# Patient Record
Sex: Male | Born: 1955 | Race: Black or African American | Hispanic: No | Marital: Single | Smoking: Never smoker
Health system: Southern US, Community
[De-identification: ages and names within clinical notes are randomized; demographics above are authoritative.]

## PROBLEM LIST (undated history)

## (undated) DIAGNOSIS — Q909 Down syndrome, unspecified: Secondary | ICD-10-CM

## (undated) DIAGNOSIS — M109 Gout, unspecified: Secondary | ICD-10-CM

## (undated) DIAGNOSIS — M1990 Unspecified osteoarthritis, unspecified site: Secondary | ICD-10-CM

## (undated) DIAGNOSIS — E7800 Pure hypercholesterolemia, unspecified: Secondary | ICD-10-CM

## (undated) DIAGNOSIS — R1013 Epigastric pain: Secondary | ICD-10-CM

## (undated) DIAGNOSIS — E876 Hypokalemia: Secondary | ICD-10-CM

## (undated) DIAGNOSIS — E669 Obesity, unspecified: Secondary | ICD-10-CM

## (undated) DIAGNOSIS — K5900 Constipation, unspecified: Secondary | ICD-10-CM

## (undated) HISTORY — DX: Obesity, unspecified: E66.9

## (undated) HISTORY — PX: NO PAST SURGERIES: SHX2092

## (undated) HISTORY — DX: Constipation, unspecified: K59.00

## (undated) HISTORY — DX: Pure hypercholesterolemia, unspecified: E78.00

## (undated) HISTORY — DX: Epigastric pain: R10.13

## (undated) HISTORY — DX: Gout, unspecified: M10.9

## (undated) HISTORY — DX: Unspecified osteoarthritis, unspecified site: M19.90

## (undated) HISTORY — DX: Down syndrome, unspecified: Q90.9

---

## 2000-10-16 ENCOUNTER — Encounter: Admission: RE | Admit: 2000-10-16 | Discharge: 2000-10-16 | Payer: Self-pay | Admitting: Internal Medicine

## 2000-10-16 ENCOUNTER — Encounter: Payer: Self-pay | Admitting: Internal Medicine

## 2000-12-03 ENCOUNTER — Inpatient Hospital Stay (HOSPITAL_COMMUNITY): Admission: AD | Admit: 2000-12-03 | Discharge: 2000-12-09 | Payer: Self-pay | Admitting: Internal Medicine

## 2000-12-03 ENCOUNTER — Encounter: Payer: Self-pay | Admitting: Internal Medicine

## 2000-12-05 ENCOUNTER — Encounter: Payer: Self-pay | Admitting: Internal Medicine

## 2000-12-09 ENCOUNTER — Encounter: Payer: Self-pay | Admitting: Internal Medicine

## 2004-02-02 ENCOUNTER — Observation Stay (HOSPITAL_COMMUNITY): Admission: RE | Admit: 2004-02-02 | Discharge: 2004-02-02 | Payer: Self-pay | Admitting: *Deleted

## 2005-06-06 ENCOUNTER — Encounter: Admission: RE | Admit: 2005-06-06 | Discharge: 2005-06-06 | Payer: Self-pay | Admitting: Internal Medicine

## 2008-11-22 ENCOUNTER — Encounter: Admission: RE | Admit: 2008-11-22 | Discharge: 2008-11-22 | Payer: Self-pay | Admitting: Internal Medicine

## 2009-02-12 ENCOUNTER — Emergency Department (HOSPITAL_COMMUNITY): Admission: EM | Admit: 2009-02-12 | Discharge: 2009-02-12 | Payer: Self-pay | Admitting: Emergency Medicine

## 2009-03-20 ENCOUNTER — Encounter: Admission: RE | Admit: 2009-03-20 | Discharge: 2009-03-20 | Payer: Self-pay | Admitting: Internal Medicine

## 2011-02-15 LAB — BASIC METABOLIC PANEL
Potassium: 3.7 mmol/L (ref 3.4–5.3)
Sodium: 141 mmol/L (ref 137–147)

## 2011-04-12 ENCOUNTER — Encounter: Payer: Self-pay | Admitting: Internal Medicine

## 2011-05-17 NOTE — Op Note (Signed)
NAMEJAKAIDEN, Richard Greene                            ACCOUNT NO.:  [id number]   MEDICAL RECORD NO.:  [id number]                   PATIENT TYPE:  INP   LOCATION:  2550                                 FACILITY:  [facility]   PHYSICIAN:  Scott R. Rehm, D.D.S.               DATE OF BIRTH:  12-29-1956   DATE OF PROCEDURE:  02/02/2004  DATE OF DISCHARGE:                                 OPERATIVE REPORT   PREOPERATIVE DIAGNOSES:  1. Mental retardation.  2. Down's syndrome.  3. Dental caries.   POSTOPERATIVE DIAGNOSES:  1. Mental retardation.  2. Down's syndrome.  3. Dental caries.   PROCEDURES PERFORMED:  1. Examination under anesthesia.  2. Dental x-rays.  3. Surgical extraction of teeth number 3, 4, 6, 7, 9, 12, 15, 13, 21, 28,     and 31.  4. Alveoloplasty upper right, upper left, lower left, and lower right     quadrants.   SURGEON:  Scott R. Egbert Garibaldi, D.D.S.   MEDS GIVEN PRE-OP:  Ampicillin 1 gram IV piggyback.   SUTURE:  3-0 chromic gut times multiple.   Local with 2% Xylocaine, 1:100,000 epinephrine 7 cc.   BRIEF DESCRIPTION OF THE OPERATIVE INDICATIONS:  Richard Greene is a 55 year old  black male with Down's syndrome referred from [facility] emergency  room with conditions of complaint of dental caries, toothaches for several  weeks. The patient was seen at my office on January 10, 2004, and he had a  history of Down's syndrome, inability to examine at the office.  Does have  history of gout.  I spoke with Dr. Minerva Areola L. Dean, phone number [phone], and  he was to admit Richard Greene for 23-hour observation for [place] to remove his teeth.  A  crude Panorex was able to be taken in my office, but the potential procedure  was exam under anesthesia and removal of teeth as necessary under general  anesthesia.   BRIEF DESCRIPTION OF OPERATIVE TECHNIQUE:  Richard Greene was brought to the  operating room and under sedated conditions from the anesthesiologist  already, the patient was kept on his stretcher, and  the patient was then  intubated on the table there.  The patient was then transferred to the OR  table with several nurses at which point the patient was then examined under  anesthesia, and dental x-rays were taken throughout the whole mouth. Those  were taken through the regions, upper right, upper left, lower left, lower  right times multiple.  These were then sent to the [facility] where  they were developed. At this point, the patient was then prepped and draped  in a standard fashion for an intraoral maxillofacial surgical procedure.  It  was elected to at this point to give 2% Xylocaine, 1:100,000 epinephrine x 7  cc.  This was given to the upper right, upper left, greater palatine  incisive nerve blocks were given,  inferior alveolar, long buccal, and buccal  infiltration was given to the mandible.  At this point, the x-rays were  starting to come out, and it was noted that Richard Greene had gross caries on teeth  3, 4, 6, 7, 9, 12, 13, 15, 21, 28, and 31.  At this point, a full thickness  mucoperiosteal flap was elevated to go from tooth 3, 4 and 6, 7, 9 area.  A  full thickness mucoperiosteal flap was elevated from tooth numbers 12 to 13  region and 15.  The Stryker and #8 round bur was used to cut above the  trough around tooth numbers 3, 4, 6, 7, 9, 12, 13, and 15 at which point a  periosteal elevator was used for minor subluxation of the teeth.  A straight  elevator was used, and a upper right anatomic forceps was used for  extraction of tooth number 3.  An upper Universal forceps was used for tooth  4, 6, 7, 9, 12, the small root on 13, and upper left anatomic forceps was  used for extraction of tooth number 15.  These were then done without any  complications.  Alveolar bur was then done.  Irrigation and direct suture  figure-of-8 x 6 were placed.  At this point, the low right region was then  addressed.  A #15 blade was used to make an incision from tooth #27, 28, and  then  from 30 to 31 region.  A periosteal elevator was used to elevate the  periosteum and its regions.  A #8 round bur was used for minor osseous  reduction followed by using a __________ straight elevator.  Tooth #28 and  31 were removed without complications.  Alveoloplasty occurred in this  region.  At this point, irrigation followed, and 3-0 chromic gut sutures x 6  were placed.  The bite block was then transferred to the patient's right  side, and the endotracheal tube was then transferred to the patient's right  side also with a new throat pack being placed after the old one was removed.  Full thickness mucoperiostomy with a 15 blade on tooth number 21/22 region.  A #8 round bur once again was used for osseous reduction, and a straight  elevator was used for removal of #21.  Alveoloplasty in this lower left  region was then done at this point.  Irrigation followed.  The 3-0 chromic  gut suture x 4 was placed.  At this point, the oral cavity was suctioned.  The throat pack was removed.  The needle, sponge, and instrument counts all  found to be correct.  No specimens were used.  They went to Pathology.   DISPOSITION:  The patient was then transferred to the recovery room, vital  signs stable.  The patient was to have 23-hour admission via Dr. Gwenyth Bender.                                               Scott R. Egbert Garibaldi, D.D.S.    SRR/MEDQ  D:  02/02/2004  T:  02/02/2004  Job:  [id number]   cc:   Minerva Areola L. August Saucer, M.D.  [address] [address]  [place]  [place] [address]  Fax: [phone]

## 2011-05-17 NOTE — Discharge Summary (Signed)
[place]  Patient:    Richard Greene, Richard Greene                         MRN: [id number] Adm. Date:  [id number] Disc. Date: [id number] Attending:  Gwenyth Bender                           Discharge Summary  FINAL DIAGNOSES:  1. Progressive colonic dysfunction.  2. Nausea and vomiting secondary to #1.  3. Downs syndrome progression.  4. Chronic ____________  5. Unsteady gait secondary to the above.  6. Obesity.  OPERATION PERFORMED:  1. Endoscopy flexible proctosigmoidoscopy on December 05, 2000.  2. CT scan of the head.  HISTORY OF PRESENT ILLNESS:  This was the first recent [place] admission for this 55 year old black male with severe Downs syndrome, who was brought in by his mother for evaluation of multiple complaints.  Over the past several months, the patient has had episodic nausea and vomiting which had been occurring spontaneously.  This has been associated with episodic constipation followed by diarrhea.  He has been known to have ____________ constipation demonstrated by x-ray.  Medications for this and empiric trials have not resulted in improvement of his nausea.  His oral intake had decreased recently as a result of this.  He has had occasional questionably melanotic stools as well.  Because of his severe mental retardation and inability to evaluate the patient thoroughly, he was admitted for further evaluation thereafter.  PAST MEDICAL HISTORY:  As noted above.  PHYSICAL EXAMINATION:  As noted above.  He has been previously managed in a day center.  Recently had had problems with increasing agitation for which he had been started on Risperdal which has been most effective.  Family has also noted that he has not been as ambulatory over the past several weeks as well.  HOSPITAL COURSE:  The patient was admitted for further evaluation of episodic nausea and vomiting of questionable etiology.  There was question  of underlying gastropathology.  The patient was noted to have difficulty with ambulation as well.  He was seen in consultation by Dr. Danise Edge of Gastroenterology.  It is felt that the patient would indeed need further evaluation including an EGD and flexible proctosigmoidoscopy.  This was subsequently scheduled for December 7 once patient was felt to be stable otherwise.  He underwent this procedure and then tolerated it well.  The hypopharynx, esophagus and stomach appeared okay.  They were unable to intubate the descending duodenum with a question of obstruction.  It was recommended that upper GI be obtained.  He notably also was seen by  Dr. Thad Ranger of neurology.  It was felt that his gait failure was most likely secondary to his underlying ____________ disease.  A CT scan, however, was recommended.  There was considerable difficulty obtaining scan due to the patients inability to cooperative.  He did require mild sedation with attendance of a family member.  He was subsequently found to have cortical atrophy with underlying small vessel disease.  No evidence suggesting acute stroke.  There was some question of some demineralization of the skull, however.  It was Dr. Renita Papa opinion that no further evaluation of patients gait disturbance would be pursued.  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  This was, however, more difficult to evaluate in  the patient due to his profound retardation.  The patients oral intake was noted to be impaired  while at the hospital. After cleansing enemas, the patients appetite did gradually improve.  He was, however, noted to have some problems swallowing.  He subsequently underwent a modified barium swallow with upper GI and small bowel follow-through.  Family members assisted him with this as well.  He was noted to have an extremely delayed initiation of the swallowing mechanism.  No obstruction was  noted. There was some evidence of tertiary wave formation suggesting a hiatal hernia. This was not definitely identified, however.  He was also noted to have incomplete rotation of the colon.  With these findings, it was felt that further management of patient could be pursued as an outpatient per Medicare guidelines.  This was explained to family.  Further adjustments of his medications were made including the introduction of Reglan at 10 mg p.o. h.c.  He will be maintained on Miralax. On this, he tolerated this well.  The patient was subsequently discharged home on December 09, 2000.  DISCHARGE MEDICATIONS:  1. Reglan 10 mg p.o. h.c.  2. Miralax 1 teaspoon in water q.d.  3. Risperdal 1 to 2 mg q.d. at 6 p.m.  4. ____________ nasal spray 2 puffs b.i.d.  5. Nexium 40 mg q.d. for two weeks.  He is to be maintained on a full liquid diet advancing as tolerated.  Family has been instructed to adjust his Risperdal as needed for significant agitation after consultation.  He will be seen in the office in three weeks time for follow-up. DD:  01/08/01 TD:  01/08/01 Job: [id number]

## 2011-05-17 NOTE — H&P (Signed)
[place]  Patient:    Richard Greene, Richard Greene                         MRN: [id number] Adm. Date:  [id number] Attending:  Gwenyth Bender                         History and Physical  CHIEF COMPLAINT:  Episodic nausea and vomiting, progressive weakness, and rectal bleeding.  HISTORY OF PRESENT ILLNESS:  This is the first recent [facility] admission for this 55 year old black male with severe down syndrome, who was brought in by his mother for evaluation of multiple complaints.  The patient over the past few months has had episodic nausea and vomiting which occurred spontaneously.  This has been associated with episodic constipation followed by diarrhea.  He has been treated with stool softening agents for possible atypical constipation.  X-rays have only showed mild constipation in the past at most.  The patient most recently was given an empiric trial of Nexium for his episodic nausea due to limited ability to evaluate as an outpatient.  Mother reports he had some initial improvement with decreased episodes.  However, over the past five days, he has had recurrent bouts of nausea without actual vomiting.  Notably, she has noted occasional severely dark, questionably melanotic stools.  He has been known to have rectal bleeding as well.  GI evaluation has been limited.  The family has also noted progressive weakness of his lower extremities. The mother notes that he will occasionally drag his right leg when walking.  They have noted increasing difficulty getting him to ambulate.  The patient has no significant shortness of breath on observation.  Appetite has remained fair to good on a daily basis.  Mentally, they have noted that he has been intermittently lethargic.  This was felt to be secondary to his Risperdal medication which had been recently decreased.  There has been no documented fever, no rigors noted.  Rare coughing.  Mother has noted  that he occasionally will gag after swallowing foods.  PAST MEDICAL HISTORY:  As noted above.  The patient with down syndrome with profound mental retardation.  He has been under the care of his mother.  He had previously attended a Day Center.  He was given Risperdal to help with excessive agitation which he has tolerated well.  Past medical history otherwise unremarkable.  MEDICATIONS: 1. Risperdal 2 mg p.o. b.i.d. 2. Nexium 40 mg p.o. q.h.s.  PHYSICAL EXAMINATION:  GENERAL:  He is a well-developed, overweight black male in no acute distress.  VITAL SIGNS:  Height 5 feet 2 inches, weight 194 pounds.  Blood pressure 112/64, pulse 92, respiratory rate 20, temperature 97.8.  HEENT:  Head normocephalic and atraumatic.  Fundi poorly visualized.  Pupils are equal, round and reactive to light.  Nose; he has left turbinate edema greater than right, presently doing more mouth breathing.  Thickened tongue. TMs are clear.  NECK:  No pulsing cervical nodes.  LUNGS:  Clear to passive breathing.  No rubs appreciated.  Clear to percussion.  HEART:  Normal S1 and S2.  No S3, S4, murmurs, or rubs.  ABDOMEN:  Mild distention.  Tympanic in the upper quadrants.  Dullness in the left lower quadrant.  No grimacing on compression at this time.  EXTREMITIES:  Full passive range of motion with some resistance noted. Negative Homans.  No edema.  NEUROLOGICAL:  The patient is awake.  He will follow on observation.  Will not perform manual commands.  1+ DTRs in the elbows bilaterally.  Trace knee DTRs bilaterally.  Unable to cooperative for strength testing.  SKIN:  Without active lesions.  LABORATORY DATA:  CBC 5200, hemoglobin 14, hematocrit 39.4.  Normal differential.  RDW 15.0.  Electrolytes; sodium 139, potassium 3.7, chloride 103, CO2 30, BUN 14, creatinine 1.0, calcium 8.8.  Amylase 39.  INR 1.0.  CT scan of LS spine pending.  IMPRESSION: 1. Episodic nausea and vomiting of questionable  etiology.  The patient with    questionable history of melanotic stools by report.  Rule out occult    peptic ulcer disease.  Rule out reflux.  Rule out gallbladder disease. 2. Colonic dysfunction.  The patient with bouts of constipation alternating    with diarrhea. 3. Rule out degenerative disk disease with questionable diskitis seen on    previous x-ray.  The patient symptomatically has been documented to have    progressive lower extremity weakness. 4. Down syndrome with severe mental retardation. 5. Mild obesity.  PLAN:  The patient will undergo GI evaluation with endoscopy and colon and/or sigmoidoscopy.  We will also obtain neurological evaluation of his lower back. He will need sedation for the above noted procedures.  Will continue him on his Risperdal in the interim.  Abdominal ultrasound will be obtained as well as indicated.  Further therapy thereafter.  Will also ask social services to review home situation to see if any further aid can be given to the mother as well. DD:  12/04/00 TD:  12/04/00 Job: [id number]

## 2011-05-17 NOTE — Consult Note (Signed)
[place]  Patient:    Richard Greene, Richard Greene                         MRN: [id number] Proc. Date: 12/06/00 Adm. Date:  [id number] Attending:  Gwenyth Bender                          Consultation Report  DATE OF BIRTH:  October 14, 1946  REQUESTING PHYSICIAN:  Lind Guest. August Saucer, M.D.  REASON FOR EVALUATION:  Gait difficulty.  HISTORY OF PRESENT ILLNESS:  This is the initial inpatient consultation evaluation for this 55 year old man with history of Down syndrome and profound mental retardation who is presently admitted for a GI work-up for nausea, vomiting, constipation, and hematochezia.  He has had a over a one year period of time he has developed progressive problems with gait and some weakness of the lower extremities.  History was provided by his mother who notes that he initially had difficulty standing out of a seated position and this has slowly progressed.  More recently he has had some easily fatigability with walking and increasing wide-based waddling gait.  She also notes that he tends to do some posturing of his left upper extremity when he walks.  He continues to be able to move all extremities spontaneously.  There has been no noted atrophy of the lower extremities.  He is having some diarrhea, but is not having bowel or bladder incontinence and is able to get to the bathroom.  There has been no evidence of sensory loss in the lower extremities.  She does think he has had some low back pain recently.  REVIEW OF SYSTEMS:  Nausea and vomiting along with hematochezia as above.  A 10 pounds weight loss over the past two months.  Low back pain as above.  No fevers, chills.  No known injury of the back or lower extremities.  No obvious mental status changes.  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  FAMILY HISTORY:  No known significant family illnesses.  SOCIAL  HISTORY:  He lives with his mom and dad who are care providers.  He also has a brother and sister and a couple of aunts who help them out.  He is able to ambulate independently, but is not able to participate in any of his activities of daily living or self care.  MEDICATIONS:  Risperdal, Protonix in hospital.  ALLERGIES:  No known drug allergies.  PHYSICAL EXAMINATION:  VITAL SIGNS:  Temperature 98.6, blood pressure 102/62, pulse 92, respirations 20, O2 saturation 98% on room air.  GENERAL:  He is alert, in no evident distress.  CHEST:  Clear to auscultation with decreased effort.  HEART:  Regular rate and rhythm without murmurs.  NECK:  Supple without carotid bruits.  EXTREMITIES:  No cyanosis.  Pulses 2+.  There is a 10 degree flexion contracture of both knees, but none at the ankles.  NEUROLOGIC:  Mental status:  He appears alert.  He is not able to follow commands, but dose respond to direct stimuli from the examiner.  He occasionally regards the examiner, but this is intermittent.  There is really no speech, but he does occasionally make grunting sounds.  Cranial nerves: Blind in the right eye.  Left pupil is reactive and both eyes move normally in all directions.  There  is no facial asymmetry.  He tends to keep the tongue protruded, but seems to move the mouth and tongue symmetrically.  Motor examination:  Normal bulk and tone.  No atrophy or vesiculations.  He is able to move all extremities well against gravity.  Sensory examination: He withdraws from tickle in all extremities vigorously.  Cerebellar is not tested.  Rapid alternating movements are not tested.  Reflexes are 2+ and symmetric.  Toes are downgoing.  Gait examination:  He requires two person assist to stand from the bed; however, he is able to walk independently with standby assist.  His gait is wide-based and waddling.  He tends to walk with the feet, particularly the left foot, externally rotated.  He  does not appear to be in pain when he walks.  LABORATORIES:  A CT of the lumbosacral spine is performed and shows evidence of discitis at L4/5 with mild posterior disk bulge and question of chronic infection.  CBC and BMET are normal.  Coags are normal.  Amylase is normal.  IMPRESSION: 1. Progressive gait disturbance.  Suspect this is more of a central process    rather than a peripheral process related to his discitis.  Would be    primarily concerned about progression of his known brain disease or about    interval development of hydrocephalus.  Cervical myelopathy is also a    possibility but he does not really have typical examination stigmata of    this. 2. Lumbosacral discitis.  This may be contributing to discomfort in his back    which may be part of the problem, but doubt that it is primary. 3. Down syndrome with profound mental retardation.  RECOMMENDATIONS: 1. A CT of the head to rule out hydrocephalus. 2. Physical therapy evaluation for gait assistance. 3. If he does not have hydrocephalus or a correctable cervical spine problem,    it is unlikely that this is going to be a fixable problem. DD:  12/04/00 TD:  12/04/00 Job: [id number]

## 2012-12-14 ENCOUNTER — Encounter: Payer: Self-pay | Admitting: Hematology

## 2012-12-14 DIAGNOSIS — K599 Functional intestinal disorder, unspecified: Secondary | ICD-10-CM | POA: Insufficient documentation

## 2012-12-14 DIAGNOSIS — M109 Gout, unspecified: Secondary | ICD-10-CM | POA: Insufficient documentation

## 2012-12-14 DIAGNOSIS — K056 Periodontal disease, unspecified: Secondary | ICD-10-CM

## 2012-12-14 DIAGNOSIS — K5900 Constipation, unspecified: Secondary | ICD-10-CM | POA: Insufficient documentation

## 2012-12-14 DIAGNOSIS — F79 Unspecified intellectual disabilities: Secondary | ICD-10-CM | POA: Insufficient documentation

## 2012-12-14 DIAGNOSIS — M1990 Unspecified osteoarthritis, unspecified site: Secondary | ICD-10-CM | POA: Insufficient documentation

## 2013-07-30 ENCOUNTER — Inpatient Hospital Stay (HOSPITAL_COMMUNITY): Payer: Medicare Other

## 2013-07-30 ENCOUNTER — Encounter (HOSPITAL_COMMUNITY): Payer: Self-pay

## 2013-07-30 ENCOUNTER — Emergency Department (HOSPITAL_COMMUNITY): Payer: Medicare Other

## 2013-07-30 ENCOUNTER — Inpatient Hospital Stay (HOSPITAL_COMMUNITY)
Admission: EM | Admit: 2013-07-30 | Discharge: 2013-08-06 | DRG: 392 | Disposition: A | Discharge status: Home Health | Payer: Medicare Other | Attending: Internal Medicine | Admitting: Internal Medicine

## 2013-07-30 DIAGNOSIS — K056 Periodontal disease, unspecified: Secondary | ICD-10-CM

## 2013-07-30 DIAGNOSIS — K599 Functional intestinal disorder, unspecified: Secondary | ICD-10-CM

## 2013-07-30 DIAGNOSIS — E860 Dehydration: Secondary | ICD-10-CM

## 2013-07-30 DIAGNOSIS — N179 Acute kidney failure, unspecified: Secondary | ICD-10-CM | POA: Diagnosis not present

## 2013-07-30 DIAGNOSIS — K5289 Other specified noninfective gastroenteritis and colitis: Principal | ICD-10-CM | POA: Diagnosis present

## 2013-07-30 DIAGNOSIS — R112 Nausea with vomiting, unspecified: Secondary | ICD-10-CM

## 2013-07-30 DIAGNOSIS — Q909 Down syndrome, unspecified: Secondary | ICD-10-CM

## 2013-07-30 DIAGNOSIS — K5900 Constipation, unspecified: Secondary | ICD-10-CM

## 2013-07-30 DIAGNOSIS — E2749 Other adrenocortical insufficiency: Secondary | ICD-10-CM | POA: Diagnosis present

## 2013-07-30 DIAGNOSIS — F79 Unspecified intellectual disabilities: Secondary | ICD-10-CM

## 2013-07-30 DIAGNOSIS — I498 Other specified cardiac arrhythmias: Secondary | ICD-10-CM

## 2013-07-30 DIAGNOSIS — M1990 Unspecified osteoarthritis, unspecified site: Secondary | ICD-10-CM

## 2013-07-30 DIAGNOSIS — R569 Unspecified convulsions: Secondary | ICD-10-CM

## 2013-07-30 DIAGNOSIS — E876 Hypokalemia: Secondary | ICD-10-CM | POA: Diagnosis not present

## 2013-07-30 DIAGNOSIS — R197 Diarrhea, unspecified: Secondary | ICD-10-CM

## 2013-07-30 DIAGNOSIS — M109 Gout, unspecified: Secondary | ICD-10-CM

## 2013-07-30 DIAGNOSIS — I959 Hypotension, unspecified: Secondary | ICD-10-CM | POA: Diagnosis present

## 2013-07-30 DIAGNOSIS — R001 Bradycardia, unspecified: Secondary | ICD-10-CM

## 2013-07-30 DIAGNOSIS — K529 Noninfective gastroenteritis and colitis, unspecified: Secondary | ICD-10-CM

## 2013-07-30 DIAGNOSIS — E2740 Unspecified adrenocortical insufficiency: Secondary | ICD-10-CM

## 2013-07-30 DIAGNOSIS — N390 Urinary tract infection, site not specified: Secondary | ICD-10-CM

## 2013-07-30 DIAGNOSIS — A498 Other bacterial infections of unspecified site: Secondary | ICD-10-CM | POA: Diagnosis present

## 2013-07-30 DIAGNOSIS — E669 Obesity, unspecified: Secondary | ICD-10-CM | POA: Diagnosis present

## 2013-07-30 DIAGNOSIS — D649 Anemia, unspecified: Secondary | ICD-10-CM | POA: Diagnosis present

## 2013-07-30 DIAGNOSIS — D72829 Elevated white blood cell count, unspecified: Secondary | ICD-10-CM | POA: Diagnosis present

## 2013-07-30 DIAGNOSIS — E7800 Pure hypercholesterolemia, unspecified: Secondary | ICD-10-CM | POA: Diagnosis present

## 2013-07-30 DIAGNOSIS — E870 Hyperosmolality and hypernatremia: Secondary | ICD-10-CM | POA: Diagnosis present

## 2013-07-30 DIAGNOSIS — F411 Generalized anxiety disorder: Secondary | ICD-10-CM | POA: Diagnosis present

## 2013-07-30 HISTORY — DX: Hypokalemia: E87.6

## 2013-07-30 LAB — MAGNESIUM: Magnesium: 2.8 mg/dL — ABNORMAL HIGH (ref 1.5–2.5)

## 2013-07-30 LAB — CREATININE, SERUM
Creatinine, Ser: 2.04 mg/dL — ABNORMAL HIGH (ref 0.50–1.35)
GFR calc Af Amer: 41 mL/min — ABNORMAL LOW (ref >90)
GFR calc non Af Amer: 35 mL/min — ABNORMAL LOW (ref >90)

## 2013-07-30 LAB — CBC
Platelets: 214 10*3/uL (ref 150–400)
RBC: 2.94 MIL/uL — ABNORMAL LOW (ref 4.22–5.81)
RDW: 17.3 % — ABNORMAL HIGH (ref 11.5–15.5)
WBC: 15.9 10*3/uL — ABNORMAL HIGH (ref 4.0–10.5)

## 2013-07-30 LAB — URINE MICROSCOPIC-ADD ON

## 2013-07-30 LAB — COMPREHENSIVE METABOLIC PANEL
ALT: 6 U/L (ref 0–53)
Alkaline Phosphatase: 77 U/L (ref 39–117)
BUN: 25 mg/dL — ABNORMAL HIGH (ref 6–23)
CO2: 30 mEq/L (ref 19–32)
Chloride: 104 mEq/L (ref 96–112)
GFR calc Af Amer: 38 mL/min — ABNORMAL LOW (ref >90)
Glucose, Bld: 94 mg/dL (ref 70–99)
Potassium: <2 mEq/L — CL (ref 3.5–5.1)
Total Bilirubin: 1 mg/dL (ref 0.3–1.2)

## 2013-07-30 LAB — BASIC METABOLIC PANEL
Calcium: 7.5 mg/dL — ABNORMAL LOW (ref 8.4–10.5)
Calcium: 7.3 mg/dL — ABNORMAL LOW (ref 8.4–10.5)
Creatinine, Ser: 2.03 mg/dL — ABNORMAL HIGH (ref 0.50–1.35)
GFR calc Af Amer: 41 mL/min — ABNORMAL LOW (ref >90)
GFR calc Af Amer: 44 mL/min — ABNORMAL LOW (ref >90)
GFR calc non Af Amer: 35 mL/min — ABNORMAL LOW (ref >90)
GFR calc non Af Amer: 38 mL/min — ABNORMAL LOW (ref >90)
Glucose, Bld: 106 mg/dL — ABNORMAL HIGH (ref 70–99)
Sodium: 144 mEq/L (ref 135–145)

## 2013-07-30 LAB — URINALYSIS, ROUTINE W REFLEX MICROSCOPIC
Glucose, UA: NEGATIVE mg/dL
Ketones, ur: NEGATIVE mg/dL
Protein, ur: 100 mg/dL — AB
pH: 6 (ref 5.0–8.0)

## 2013-07-30 LAB — CBC WITH DIFFERENTIAL/PLATELET
Hemoglobin: 10.1 g/dL — ABNORMAL LOW (ref 13.0–17.0)
Lymphocytes Relative: 5 % — ABNORMAL LOW (ref 12–46)
Lymphs Abs: 0.8 10*3/uL (ref 0.7–4.0)
MCH: 34 pg (ref 26.0–34.0)
Monocytes Relative: 5 % (ref 3–12)
Neutro Abs: 14.1 10*3/uL — ABNORMAL HIGH (ref 1.7–7.7)
Neutrophils Relative %: 90 % — ABNORMAL HIGH (ref 43–77)
RBC: 2.97 MIL/uL — ABNORMAL LOW (ref 4.22–5.81)
WBC: 15.7 10*3/uL — ABNORMAL HIGH (ref 4.0–10.5)

## 2013-07-30 LAB — GLUCOSE, CAPILLARY: Glucose-Capillary: 84 mg/dL (ref 70–99)

## 2013-07-30 LAB — LIPASE, BLOOD: Lipase: 16 U/L (ref 11–59)

## 2013-07-30 MED ORDER — SODIUM CHLORIDE 0.9 % IV SOLN: 250.0000 mL | INTRAVENOUS | Status: DC | PRN

## 2013-07-30 MED ORDER — SODIUM CHLORIDE 0.9 % IV BOLUS (SEPSIS)
1000.0000 mL | Freq: Once | INTRAVENOUS | Status: AC
  Administered 2013-07-30: 1000 mL via INTRAVENOUS

## 2013-07-30 MED ORDER — HYDROCORTISONE SOD SUCCINATE 100 MG IJ SOLR
50.0000 mg | Freq: Four times a day (QID) | INTRAMUSCULAR | Status: DC
  Administered 2013-07-30 – 2013-08-01 (×6): 50 mg via INTRAVENOUS
  Filled 2013-07-30 (×11): qty 1

## 2013-07-30 MED ORDER — POTASSIUM CHLORIDE 10 MEQ/100ML IV SOLN
INTRAVENOUS | Status: AC
  Filled 2013-07-30: qty 100

## 2013-07-30 MED ORDER — HEPARIN SODIUM (PORCINE) 5000 UNIT/ML IJ SOLN
5000.0000 [IU] | Freq: Three times a day (TID) | INTRAMUSCULAR | Status: DC
  Administered 2013-07-30 – 2013-08-05 (×13): 5000 [IU] via SUBCUTANEOUS
  Filled 2013-07-30 (×24): qty 1

## 2013-07-30 MED ORDER — POTASSIUM CHLORIDE 10 MEQ/100ML IV SOLN
10.0000 meq | Freq: Once | INTRAVENOUS | Status: AC
  Administered 2013-07-30 (×2): 10 meq via INTRAVENOUS
  Filled 2013-07-30: qty 100

## 2013-07-30 MED ORDER — POTASSIUM CHLORIDE 20 MEQ/15ML (10%) PO LIQD
40.0000 meq | Freq: Once | ORAL | Status: AC
  Administered 2013-07-30: 40 meq via ORAL
  Filled 2013-07-30: qty 30

## 2013-07-30 MED ORDER — DEXTROSE 5 % IV SOLN
1.0000 g | Freq: Once | INTRAVENOUS | Status: AC
  Administered 2013-07-30: 1 g via INTRAVENOUS
  Filled 2013-07-30: qty 10

## 2013-07-30 MED ORDER — POTASSIUM CHLORIDE 10 MEQ/100ML IV SOLN
10.0000 meq | INTRAVENOUS | Status: AC
  Administered 2013-07-30 (×3): 10 meq via INTRAVENOUS
  Filled 2013-07-30: qty 400

## 2013-07-30 MED ORDER — MAGNESIUM SULFATE 40 MG/ML IJ SOLN
2.0000 g | Freq: Once | INTRAMUSCULAR | Status: AC
  Administered 2013-07-30: 2 g via INTRAVENOUS
  Filled 2013-07-30: qty 50

## 2013-07-30 MED ORDER — CIPROFLOXACIN IN D5W 400 MG/200ML IV SOLN
400.0000 mg | Freq: Two times a day (BID) | INTRAVENOUS | Status: DC
  Administered 2013-07-30 – 2013-08-03 (×8): 400 mg via INTRAVENOUS
  Filled 2013-07-30 (×10): qty 200

## 2013-07-30 MED ORDER — METRONIDAZOLE IN NACL 5-0.79 MG/ML-% IV SOLN
500.0000 mg | Freq: Four times a day (QID) | INTRAVENOUS | Status: DC
  Administered 2013-07-30 – 2013-08-01 (×7): 500 mg via INTRAVENOUS
  Filled 2013-07-30 (×9): qty 100

## 2013-07-30 MED ORDER — POTASSIUM CHLORIDE 10 MEQ/100ML IV SOLN
INTRAVENOUS | Status: AC
  Administered 2013-07-30: 10 meq via INTRAVENOUS
  Filled 2013-07-30: qty 100

## 2013-07-30 MED ORDER — ONDANSETRON HCL 4 MG/2ML IJ SOLN
4.0000 mg | Freq: Once | INTRAMUSCULAR | Status: AC
  Administered 2013-07-30: 4 mg via INTRAVENOUS
  Filled 2013-07-30: qty 2

## 2013-07-30 MED ORDER — POTASSIUM CHLORIDE IN NACL 20-0.45 MEQ/L-% IV SOLN
INTRAVENOUS | Status: DC
  Administered 2013-07-30: 1000 mL via INTRAVENOUS
  Administered 2013-07-30: 20:00:00 via INTRAVENOUS
  Filled 2013-07-30 (×4): qty 1000

## 2013-07-30 MED ORDER — PANTOPRAZOLE SODIUM 40 MG IV SOLR
40.0000 mg | Freq: Every day | INTRAVENOUS | Status: DC
  Administered 2013-07-30 – 2013-08-01 (×3): 40 mg via INTRAVENOUS
  Filled 2013-07-30 (×6): qty 40

## 2013-07-30 NOTE — Consult Note (Signed)
PULMONARY  / CRITICAL CARE MEDICINE  Name: Richard Greene MRN: [id number] DOB: 04-Jan-1956    ADMISSION DATE:  07/30/2013  REFERRING MD :  EDP - Dr. Bernette Mayers  PRIMARY SERVICE: PCCM  CHIEF COMPLAINT:  Bradycardia, Dehydration  BRIEF PATIENT DESCRIPTION: 57 y/o M, with down syndrome presented to [facility] ER on 8/1 with N/V/D x48 hours.  PCCM consulted for assistance with care per [facility].  SIGNIFICANT EVENTS / STUDIES:  8/1 - Admit with N/V/D, found to have bradycardia, profound hypokalemia  LINES / TUBES:  CULTURES: 8/1 Stool>>>   ANTIBIOTICS: Cipro 8/1>>> Flagyl 8/1>>>  HISTORY OF PRESENT ILLNESS:  57 y/o M, with PMH of down syndrome, gout, constipation, osteoarthritis who presented to [facility] ER on 8/1 with a two month hx of diarrhea, decreased appetite since 7/29 and vomiting x48 hours.  Mother is primary care giver and is a very poor historian.  Information obtained from previous medical documentation.  She notes that he has had decreased appetite over past few days.  She notes that he does not take medications on daily basis.   ER evaluation demonstrated dehydration, hypokalemia, n/v/d, and subsequent acute renal failure.  PCCM consulted to assist with ICU care.    PAST MEDICAL HISTORY :  Past Medical History  Diagnosis Date  . Osteoarthrosis, unspecified whether generalized or localized, unspecified site   . Gout, unspecified   . Obesity, unspecified   . Pure hypercholesterolemia   . Down syndrome   . Dyspepsia   . Constipation    History reviewed. No pertinent past surgical history. Prior to Admission medications   Not on File   No Known Allergies  FAMILY HISTORY:  History reviewed. No pertinent family history. SOCIAL HISTORY:  reports that he has never smoked. He does not have any smokeless tobacco history on file. His alcohol and drug histories are not on file.  REVIEW OF SYSTEMS:  Unable to complete as pt baseline is altered, Mother poor historian.   SUBJECTIVE:   VITAL  SIGNS: Temp:  [97.7 F (36.5 C)] 97.7 F (36.5 C) (08/01 1204) Pulse Rate:  [37-52] 48 (08/01 1545) Resp:  [10-20] 13 (08/01 1600) BP: (80-102)/(34-65) 91/54 mmHg (08/01 1600) SpO2:  [96 %-100 %] 100 % (08/01 1558)  HEMODYNAMICS:    VENTILATOR SETTINGS:    INTAKE / OUTPUT: Intake/Output   None     PHYSICAL EXAMINATION: General:  Chronically ill, down syndrome Neuro:  Awake, alert, MAE  HEENT:  Mm pale / dry Cardiovascular:  s1s2 rrr, brady Lungs:  resp's even/non-labored / shallow, lungs bilaterally clear Abdomen:  Round/soft, bsx4 active Musculoskeletal:  No acute deformities Skin:  Dry, pale, no edema  LABS:  CBC Recent Labs     07/30/13  1315  WBC  15.7*  HGB  10.1*  HCT  26.9*  PLT  209   Coag's No results found for this basename: APTT, INR,  in the last 72 hours BMET Recent Labs     07/30/13  1315  NA  146*  K  <2.0*  CL  104  CO2  30  BUN  25*  CREATININE  2.17*  GLUCOSE  94   Electrolytes Recent Labs     07/30/13  1315  CALCIUM  7.6*   Sepsis Markers No results found for this basename: LACTICACIDVEN, PROCALCITON, O2SATVEN,  in the last 72 hours ABG No results found for this basename: PHART, PCO2ART, PO2ART,  in the last 72 hours Liver Enzymes Recent Labs     07/30/13  1315  AST  15  ALT  6  ALKPHOS  77  BILITOT  1.0  ALBUMIN  2.2*   Cardiac Enzymes No results found for this basename: TROPONINI, PROBNP,  in the last 72 hours Glucose Recent Labs     07/30/13  1240  07/30/13  1451  GLUCAP  90  84    Imaging Ct Head Wo Contrast  07/30/2013   *RADIOLOGY REPORT*  Clinical Data:  Fall  CT HEAD WITHOUT CONTRAST CT CERVICAL SPINE WITHOUT CONTRAST  Technique:  Multidetector CT imaging of the head and cervical spine was performed following the standard protocol without intravenous contrast.  Multiplanar CT image reconstructions of the cervical spine were also generated.  Comparison:   None  CT HEAD  Findings: Moderate atrophy.  Mild  chronic microvascular ischemic change in the white matter.  No acute infarct, hemorrhage, or mass lesion.  Negative for skull fracture.  IMPRESSION: Atrophy and chronic microvascular ischemia.  No acute intracranial abnormality.  CT CERVICAL SPINE  Findings: There is transverse ligament laxity of C1 with widening of the atlanto dens interval and mild basilar invagination.  Advanced disc degeneration with disc space narrowing and spurring C3-C7.  Bilateral facet degeneration C3-4 and C4-5 and C5-6.  Negative for fracture.  IMPRESSION: Basilar vegetation with laxity of the  transverse ligament of C1.  Multilevel disc and facet degeneration.  Negative for fracture.   Original Report Authenticated By: Janeece Riggers, M.D.   Ct Cervical Spine Wo Contrast  07/30/2013   *RADIOLOGY REPORT*  Clinical Data:  Fall  CT HEAD WITHOUT CONTRAST CT CERVICAL SPINE WITHOUT CONTRAST  Technique:  Multidetector CT imaging of the head and cervical spine was performed following the standard protocol without intravenous contrast.  Multiplanar CT image reconstructions of the cervical spine were also generated.  Comparison:   None  CT HEAD  Findings: Moderate atrophy.  Mild chronic microvascular ischemic change in the white matter.  No acute infarct, hemorrhage, or mass lesion.  Negative for skull fracture.  IMPRESSION: Atrophy and chronic microvascular ischemia.  No acute intracranial abnormality.  CT CERVICAL SPINE  Findings: There is transverse ligament laxity of C1 with widening of the atlanto dens interval and mild basilar invagination.  Advanced disc degeneration with disc space narrowing and spurring C3-C7.  Bilateral facet degeneration C3-4 and C4-5 and C5-6.  Negative for fracture.  IMPRESSION: Basilar vegetation with laxity of the  transverse ligament of C1.  Multilevel disc and facet degeneration.  Negative for fracture.   Original Report Authenticated By: Janeece Riggers, M.D.     CXR: 8/1 - pending  ASSESSMENT /  PLAN:  PULMONARY A: At Risk Aspiration P:   -assess CXR -pulmonary hygiene as able -monitor for decompensation, may require ETT -limit narcs   CARDIOVASCULAR A:  Bradycardia, likely secondary to profound hypokalemia Hypercholesterolemia P:  -assess TSH -defer to cards for ECHO -Cardiology Consulted -f/u EKG in am -aggressive K replacement then follow up q2h -mag IV -pacer pads chest, atropine bedside  RENAL A:   Mild hypernatremia Hypokalemia - received 2gm mg, 40 kcl oral, 10 mEq IV in ER Acute Kidney Injury - in setting of vomiting / dehydration P:   -aggressive hydration with 1/2NS with 20 KCL -repeat BMP Q4 x 2 -KCL IV x 4 more -2gm mg now -hold bicarb with hypokalemia, will aggrevate Foley consideration  GASTROINTESTINAL A:   Nausea / Vomiting Diarrhea Obesity R/o gastroenteritis P:   -PRN zofran -NPO -PPI -see ID -assess KUB  -may  require CT, follow clinical course  HEMATOLOGIC A:   Anemia  P:  -monitor / supportive care -consider tx if hgb <7  INFECTIOUS A:   Leukocytosis - in setting of n/v/d.  Lactic acid reassuring. R/o gastroenteritis P:   -empiric cipro / flagyl for GI coverage -KUB -low threshold CT  ENDOCRINE A:  Assess hypothyroidism, adrenal dz  P:   -assess TSH  -follow glucose on BMP -may need cortisol random then stress steroids  NEUROLOGIC A:   Down Syndrome  P:   -monitor  / supportive care  Canary Brim, NP-C [facility] Pgr: [phone] or [phone]   I have personally obtained a history, examined the patient, evaluated laboratory and imaging results, formulated the assessment and plan and placed orders.  CRITICAL CARE: The patient is critically ill with multiple organ systems failure and requires high complexity decision making for assessment and support, frequent evaluation and titration of therapies, application of advanced monitoring technologies and extensive interpretation of  multiple databases. Critical Care Time devoted to patient care services described in this note is  35 minutes.    Mcarthur Rossetti. Tyson Alias, MD, FACP Pgr: [phone] [facility]   07/30/2013, 4:03 PM

## 2013-07-30 NOTE — ED Notes (Signed)
Pt transported to and from radiology on stretcher with tech, tolerated well.  

## 2013-07-30 NOTE — Progress Notes (Signed)
Unable to assess pt. Properly. Will not allow temperature or lower extremity assessments to be done. Pt. Does not talk so communication is difficult. Spoke with Mother who is poor historian.

## 2013-07-30 NOTE — ED Notes (Signed)
Urinary catheter placed using sterile technique, getting clear, yellow urine.  Catheter removed after specimen obtained, pt tolerated well.

## 2013-07-30 NOTE — H&P (Signed)
[facility] History and Physical  [name]:[id number] DOB: [birth year] DOA: 07/30/2013   PCP: Willey Blade, MD  Specialists: None  Chief Complaint: Diarrhea and vomiting on and off for a month  HPI: Richard Greene is a 57 y.o. male with a past medical history of Down syndrome, who does not take any medications at home. Richard Greene lives with his mother who has accompanied him today. Richard Greene is unable to provide any history. Mother is a very poor historian as well. Apparently, Richard Greene has been having watery stools, up to 2-3 times a day for the last one month. She is unable to describe the stool but says it has been small quantity. He's also been vomiting sporadically. There has been no history of fever or chills. Richard Greene's mother hasn't noticed any grimacing, signifying pain. She denies that the Richard Greene has been on antibiotics recently. She tells me that he has been urinating without any difficulty. Hasn't noticed any decrease in the amount urine. Due to the Richard Greene's advanced mental retardation history is extremely limited.  Home Medications: Prior to Admission medications   Not on File  None per Richard Greene's mother.  Allergies: No Known Allergies  Past Medical History: Past Medical History  Diagnosis Date  . Osteoarthrosis, unspecified whether generalized or localized, unspecified site   . Gout, unspecified   . Obesity, unspecified   . Pure hypercholesterolemia   . Down syndrome   . Dyspepsia   . Constipation     No surgery in past.  Social History:  reports that he has never smoked. He does not have any smokeless tobacco history on file. His alcohol and drug histories are not on file.  Living Situation: Lives with his mother Activity Level: Is able to ambulate independently   Family History:  History of unknown cancer in the family  Review of Systems - Unable to do due to mental retardation  Physical Examination  Filed Vitals:   07/30/13 1350 07/30/13 1422 07/30/13  1438 07/30/13 1558  BP: 86/47 87/46 90/65  80/34  Pulse: 37 41 49   Temp:      TempSrc:      Resp: 20 16 18 16   SpO2: 100% 100% 100% 100%    General appearance: distracted, slowed mentation, syndromic appearance - Down's and uncooperative Head: atraumatic Eyes: conjunctivae/corneas clear. PERRL, EOM's intact.  Throat: dry mm Neck: no adenopathy, no carotid bruit, no JVD, supple, symmetrical, trachea midline and thyroid not enlarged, symmetric, no tenderness/mass/nodules Resp: clear to auscultation bilaterally Cardio: S1S2 is bradycardic. no S3, S4. No rubs, murmurs, or bruit. GI: soft, non-tender; bowel sounds normal; no masses,  no organomegaly Extremities: extremities normal, atraumatic, no cyanosis or edema Pulses: slow but symmetric Skin: Skin color, texture, turgor normal. No rashes or lesions Lymph nodes: Cervical, supraclavicular, and axillary nodes normal. Neurologic: Advanced mental retardation, without any focal deficits  Laboratory Data: Results for orders placed during the hospital encounter of 07/30/13 (from the past 48 hour(s))  GLUCOSE, CAPILLARY     Status: None   Collection Time    07/30/13 12:40 PM      Result Value Range   Glucose-Capillary 90  70 - 99 mg/dL  CBC WITH DIFFERENTIAL     Status: Abnormal   Collection Time    07/30/13  1:15 PM      Result Value Range   WBC 15.7 (*) 4.0 - 10.5 K/uL   RBC 2.97 (*) 4.22 - 5.81 MIL/uL   Hemoglobin 10.1 (*) 13.0 - 17.0 g/dL  HCT 26.9 (*) 39.0 - 52.0 %   MCV 90.6  78.0 - 100.0 fL   MCH 34.0  26.0 - 34.0 pg   MCHC 37.0 (*) 30.0 - 36.0 g/dL   RDW 56.2 (*) 13.0 - 86.5 %   Platelets 209  150 - 400 K/uL   Neutrophils Relative % 90 (*) 43 - 77 %   Neutro Abs 14.1 (*) 1.7 - 7.7 K/uL   Lymphocytes Relative 5 (*) 12 - 46 %   Lymphs Abs 0.8  0.7 - 4.0 K/uL   Monocytes Relative 5  3 - 12 %   Monocytes Absolute 0.7  0.1 - 1.0 K/uL   Eosinophils Relative 0  0 - 5 %   Eosinophils Absolute 0.0  0.0 - 0.7 K/uL   Basophils  Relative 0  0 - 1 %   Basophils Absolute 0.0  0.0 - 0.1 K/uL  COMPREHENSIVE METABOLIC PANEL     Status: Abnormal   Collection Time    07/30/13  1:15 PM      Result Value Range   Sodium 146 (*) 135 - 145 mEq/L   Potassium <2.0 (*) 3.5 - 5.1 mEq/L   Comment: REPEATED TO VERIFY     CRITICAL RESULT CALLED TO, READ BACK BY AND VERIFIED WITH:     JULIE MARON,RN AT 1407 07/30/13 BY ZPERRY.   Chloride 104  96 - 112 mEq/L   CO2 30  19 - 32 mEq/L   Glucose, Bld 94  70 - 99 mg/dL   BUN 25 (*) 6 - 23 mg/dL   Creatinine, Ser 7.84 (*) 0.50 - 1.35 mg/dL   Calcium 7.6 (*) 8.4 - 10.5 mg/dL   Total Protein 6.1  6.0 - 8.3 g/dL   Albumin 2.2 (*) 3.5 - 5.2 g/dL   AST 15  0 - 37 U/L   ALT 6  0 - 53 U/L   Alkaline Phosphatase 77  39 - 117 U/L   Total Bilirubin 1.0  0.3 - 1.2 mg/dL   GFR calc non Af Amer 32 (*) >90 mL/min   GFR calc Af Amer 38 (*) >90 mL/min   Comment:            The eGFR has been calculated     using the CKD EPI equation.     This calculation has not been     validated in all clinical     situations.     eGFR's persistently     <90 mL/min signify     possible Chronic Kidney Disease.  LIPASE, BLOOD     Status: None   Collection Time    07/30/13  1:15 PM      Result Value Range   Lipase 16  11 - 59 U/L  CG4 I-STAT (LACTIC ACID)     Status: None   Collection Time    07/30/13  1:20 PM      Result Value Range   Lactic Acid, Venous 1.75  0.5 - 2.2 mmol/L  URINALYSIS, ROUTINE W REFLEX MICROSCOPIC     Status: Abnormal   Collection Time    07/30/13  1:52 PM      Result Value Range   Color, Urine YELLOW  YELLOW   APPearance CLOUDY (*) CLEAR   Specific Gravity, Urine 1.015  1.005 - 1.030   pH 6.0  5.0 - 8.0   Glucose, UA NEGATIVE  NEGATIVE mg/dL   Hgb urine dipstick MODERATE (*) NEGATIVE   Bilirubin Urine NEGATIVE  NEGATIVE  Ketones, ur NEGATIVE  NEGATIVE mg/dL   Protein, ur 161 (*) NEGATIVE mg/dL   Urobilinogen, UA 0.2  0.0 - 1.0 mg/dL   Nitrite NEGATIVE  NEGATIVE    Leukocytes, UA SMALL (*) NEGATIVE  URINE MICROSCOPIC-ADD ON     Status: Abnormal   Collection Time    07/30/13  1:52 PM      Result Value Range   Squamous Epithelial / LPF MANY (*) RARE   WBC, UA TOO NUMEROUS TO COUNT  <3 WBC/hpf   RBC / HPF 7-10  <3 RBC/hpf   Bacteria, UA MANY (*) RARE  GLUCOSE, CAPILLARY     Status: None   Collection Time    07/30/13  2:51 PM      Result Value Range   Glucose-Capillary 84  70 - 99 mg/dL    Radiology Reports: Ct Head Wo Contrast  07/30/2013   *RADIOLOGY REPORT*  Clinical Data:  Fall  CT HEAD WITHOUT CONTRAST CT CERVICAL SPINE WITHOUT CONTRAST  Technique:  Multidetector CT imaging of the head and cervical spine was performed following the standard protocol without intravenous contrast.  Multiplanar CT image reconstructions of the cervical spine were also generated.  Comparison:   None  CT HEAD  Findings: Moderate atrophy.  Mild chronic microvascular ischemic change in the white matter.  No acute infarct, hemorrhage, or mass lesion.  Negative for skull fracture.  IMPRESSION: Atrophy and chronic microvascular ischemia.  No acute intracranial abnormality.  CT CERVICAL SPINE  Findings: There is transverse ligament laxity of C1 with widening of the atlanto dens interval and mild basilar invagination.  Advanced disc degeneration with disc space narrowing and spurring C3-C7.  Bilateral facet degeneration C3-4 and C4-5 and C5-6.  Negative for fracture.  IMPRESSION: Basilar vegetation with laxity of the  transverse ligament of C1.  Multilevel disc and facet degeneration.  Negative for fracture.   Original Report Authenticated By: Janeece Riggers, M.D.   Ct Cervical Spine Wo Contrast  07/30/2013   *RADIOLOGY REPORT*  Clinical Data:  Fall  CT HEAD WITHOUT CONTRAST CT CERVICAL SPINE WITHOUT CONTRAST  Technique:  Multidetector CT imaging of the head and cervical spine was performed following the standard protocol without intravenous contrast.  Multiplanar CT image reconstructions  of the cervical spine were also generated.  Comparison:   None  CT HEAD  Findings: Moderate atrophy.  Mild chronic microvascular ischemic change in the white matter.  No acute infarct, hemorrhage, or mass lesion.  Negative for skull fracture.  IMPRESSION: Atrophy and chronic microvascular ischemia.  No acute intracranial abnormality.  CT CERVICAL SPINE  Findings: There is transverse ligament laxity of C1 with widening of the atlanto dens interval and mild basilar invagination.  Advanced disc degeneration with disc space narrowing and spurring C3-C7.  Bilateral facet degeneration C3-4 and C4-5 and C5-6.  Negative for fracture.  IMPRESSION: Basilar vegetation with laxity of the  transverse ligament of C1.  Multilevel disc and facet degeneration.  Negative for fracture.   Original Report Authenticated By: Janeece Riggers, M.D.    Electrocardiogram: Reveals a junctional rhythm, about 35 beats per minute. No U waves.   Assessment/Plan This is a 57 year old AA male who was brought in due to episodes of diarrhea and vomiting at home, on and off for the last one month. He is found to be in acute renal failure and has severe hypokalemia. He also has leukocytosis and has an abnormal UA. He's also anemic.  During evaluation in the emergency department Richard Greene was found  to be profoundly bradycardic. He would have long pauses and then junctional rhythm was also noted in between. This is most likely a result of the severe hypokalemia. His blood pressure was also in the 80s systolic. Richard Greene had received potassium orally and one run of . He was also given Mg by EDP. He needs aggressive replacement.  Due to the critical nature of his illness I consulted Dr. Tyson Alias from critical care medicine to evaluate this Richard Greene. I also notified [facility] cardiology as there is a possibility Richard Greene may require temporary pacing. He will need to be admitted to CCU/ICU.  Dr. Tyson Alias will assume care of this Richard Greene due to critical  nature of his illness.  Time spent on this case: One hour.  Further management decisions will depend on results of further testing and Richard Greene's response to treatment.  [facility]  [facility] Pager [phone]  If 7PM-7AM, please contact night-coverage [email or web address] Password [facility]  07/30/2013, 3:59 PM

## 2013-07-30 NOTE — ED Provider Notes (Addendum)
CSN: [id number]     Arrival date & time 07/30/13  1202 History     First MD Initiated Contact with Patient 07/30/13 1204     No chief complaint on file.  (Consider location/radiation/quality/duration/timing/severity/associated sxs/prior Treatment) HPI Comments: 57 y/o male with a PMHx of Down Syndrome presents to the ED with his mom with reports of nausea, vomiting and diarrhea beginning yesterday. Mom is a poor historian, primary care giver. States he was acting normal up until last night, however has had a decreased appetite over the past few days. 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 Does not take any home medications.  The history is provided by a parent.    Past Medical History  Diagnosis Date  . Osteoarthrosis, unspecified whether generalized or localized, unspecified site   . Gout, unspecified   . Obesity, unspecified   . Pure hypercholesterolemia   . Down syndrome   . Dyspepsia   . Constipation    History reviewed. No pertinent past surgical history. History reviewed. No pertinent family history. History  Substance Use Topics  . Smoking status: Never Smoker   . Smokeless tobacco: Not on file  . Alcohol Use: Not on file    Review of Systems  Unable to perform ROS: Patient nonverbal    Allergies  Review of patient's allergies indicates no known allergies.  Home Medications   Current Outpatient Rx  Name  Route  Sig  Dispense  Refill  . allopurinol (ZYLOPRIM) 300 MG tablet   Oral   Take 300 mg by mouth daily.           . diclofenac (VOLTAREN) 50 MG EC tablet   Oral   Take 50 mg by mouth 3 (three) times daily as needed.         Marland Kitchen LORazepam (ATIVAN) 0.5 MG tablet   Oral   Take 0.5 mg by mouth 3 (three) times daily as needed.           . ondansetron (ZOFRAN) 8 MG tablet   Oral   Take 8 mg by mouth every 6 (six) hours as needed.            BP 81/43  Pulse 52  Temp(Src) 97.7 F (36.5 C) (Axillary)  Resp 18   SpO2 96% Physical Exam  Nursing note and vitals reviewed. Constitutional: He appears well-developed and well-nourished. No distress.  HENT:  Head: Normocephalic.    Mouth/Throat: Mucous membranes are dry.  Eyes: Conjunctivae are normal.  Neck: Normal range of motion. Neck supple.  Cardiovascular: Regular rhythm, normal heart sounds and intact distal pulses.  Bradycardia present.   Pulmonary/Chest: Effort normal and breath sounds normal. No respiratory distress. He has no decreased breath sounds. He has no wheezes. He has no rhonchi. He has no rales.  Abdominal: Soft. Normal appearance and bowel sounds are normal. There is no tenderness. There is no rigidity, no rebound and no guarding.  Musculoskeletal: Normal range of motion. He exhibits no edema.  Neurological: He is alert.  Skin: Skin is warm and dry.  Psychiatric: He is noncommunicative.    ED Course   Procedures (including critical care time)  Labs Reviewed  CBC WITH DIFFERENTIAL - Abnormal; Notable for the following:    WBC 15.7 (*)    RBC 2.97 (*)    Hemoglobin 10.1 (*)    HCT 26.9 (*)    MCHC 37.0 (*)    RDW 17.3 (*)    Neutrophils Relative %  90 (*)    Neutro Abs 14.1 (*)    Lymphocytes Relative 5 (*)    All other components within normal limits  COMPREHENSIVE METABOLIC PANEL - Abnormal; Notable for the following:    Sodium 146 (*)    Potassium <2.0 (*)    BUN 25 (*)    Creatinine, Ser 2.17 (*)    Calcium 7.6 (*)    Albumin 2.2 (*)    GFR calc non Af Amer 32 (*)    GFR calc Af Amer 38 (*)    All other components within normal limits  URINALYSIS, ROUTINE W REFLEX MICROSCOPIC - Abnormal; Notable for the following:    APPearance CLOUDY (*)    Hgb urine dipstick MODERATE (*)    Protein, ur 100 (*)    Leukocytes, UA SMALL (*)    All other components within normal limits  URINE MICROSCOPIC-ADD ON - Abnormal; Notable for the following:    Squamous Epithelial / LPF MANY (*)    Bacteria, UA MANY (*)    All other  components within normal limits  URINE CULTURE  LIPASE, BLOOD  GLUCOSE, CAPILLARY  GLUCOSE, CAPILLARY  CG4 I-STAT (LACTIC ACID)    Date: 07/30/2013  Rate: 45  Rhythm: sinus bradycardia  QRS Axis: normal  Intervals: QT prolonged  ST/T Wave abnormalities: normal  Conduction Disutrbances:none  Narrative Interpretation: sinus brady, no stemi  Old EKG Reviewed: none available   Ct Head Wo Contrast  07/30/2013   *RADIOLOGY REPORT*  Clinical Data:  Fall  CT HEAD WITHOUT CONTRAST CT CERVICAL SPINE WITHOUT CONTRAST  Technique:  Multidetector CT imaging of the head and cervical spine was performed following the standard protocol without intravenous contrast.  Multiplanar CT image reconstructions of the cervical spine were also generated.  Comparison:   None  CT HEAD  Findings: Moderate atrophy.  Mild chronic microvascular ischemic change in the white matter.  No acute infarct, hemorrhage, or mass lesion.  Negative for skull fracture.  IMPRESSION: Atrophy and chronic microvascular ischemia.  No acute intracranial abnormality.  CT CERVICAL SPINE  Findings: There is transverse ligament laxity of C1 with widening of the atlanto dens interval and mild basilar invagination.  Advanced disc degeneration with disc space narrowing and spurring C3-C7.  Bilateral facet degeneration C3-4 and C4-5 and C5-6.  Negative for fracture.  IMPRESSION: Basilar vegetation with laxity of the  transverse ligament of C1.  Multilevel disc and facet degeneration.  Negative for fracture.   Original Report Authenticated By: Janeece Riggers, M.D.   Ct Cervical Spine Wo Contrast  07/30/2013   *RADIOLOGY REPORT*  Clinical Data:  Fall  CT HEAD WITHOUT CONTRAST CT CERVICAL SPINE WITHOUT CONTRAST  Technique:  Multidetector CT imaging of the head and cervical spine was performed following the standard protocol without intravenous contrast.  Multiplanar CT image reconstructions of the cervical spine were also generated.  Comparison:   None  CT HEAD   Findings: Moderate atrophy.  Mild chronic microvascular ischemic change in the white matter.  No acute infarct, hemorrhage, or mass lesion.  Negative for skull fracture.  IMPRESSION: Atrophy and chronic microvascular ischemia.  No acute intracranial abnormality.  CT CERVICAL SPINE  Findings: There is transverse ligament laxity of C1 with widening of the atlanto dens interval and mild basilar invagination.  Advanced disc degeneration with disc space narrowing and spurring C3-C7.  Bilateral facet degeneration C3-4 and C4-5 and C5-6.  Negative for fracture.  IMPRESSION: Basilar vegetation with laxity of the  transverse ligament of C1.  Multilevel disc and facet degeneration.  Negative for fracture.   Original Report Authenticated By: Janeece Riggers, M.D.   1. Dehydration   2. Hypokalemia   3. Diarrhea   4. Nausea and vomiting   5. Acute renal failure    CRITICAL CARE Performed by: Johnnette Gourd   Total critical care time: 1 hour  Critical care time was exclusive of separately billable procedures and treating other patients.  Critical care was necessary to treat or prevent imminent or life-threatening deterioration.  Critical care was time spent personally by me on the following activities: development of treatment plan with patient and/or surrogate as well as nursing, discussions with consultants, evaluation of patient's response to treatment, examination of patient, obtaining history from patient or surrogate, ordering and performing treatments and interventions, ordering and review of laboratory studies, ordering and review of radiographic studies, pulse oximetry and re-evaluation of patient's condition.    MDM  Patient with n/v/d. Non-verbal, level 5 caveat. Mom is poor historian. Workup including cbc, cmp, lipase, ua, lactate, CBG, EKG, CT head/neck. IV fluids.  CBG 90, EKG brady, prolonged QT. Lactate WNL.  2:29 PM UA with UTI. 1g IV rocephin. Still hypotensive, 87/46. Will give another 1L  bolus of fluids.  2:59 PM Profound hypokalemia. IV/PO potassium replacement with magnesium. Will start another IV for more fluids for hypotension. BP 90/65. Still alert. He will be admitted to hospitalist. Patient discussed with Dr. Bernette Mayers who agrees with plan of care. Admission accepted by Dr. Osvaldo Shipper, TRH.   Trevor Mace, PA-C 07/30/13 1502  Trevor Mace, PA-C 07/30/13 [phone]

## 2013-07-30 NOTE — Consult Note (Signed)
Cardiology Consult Note   Patient ID: Richard Greene MRN: [id number], DOB/AGE: 03/09/1956   Admit date: 07/30/2013 Date of Consult: 07/30/2013  Primary Physician: Willey Blade, MD Primary Cardiologist: New  Reason for consult: bradycardia  HPI: Richard Greene is a 57 y.o. male with no prior cardiac history, minimally known PMHx s/f down syndrome, HLD, obesity, OA who was admitted to [facility] today for dehydration and bradycardia.   The history is limited. The patient and his mother who is his primary caregiver are poor historians. He has been having frequent watery stools 2-3/day for the last month and intermittent vomiting. No fevers or chills. No apparent abdominal pain. No recent antibiotic use.    In the ED, EKG revealed sinus bradycardia, HR 33. Telemetry indicated bradycardia 30s. Labs- K< 2.0, BUN 25/Cr 2.17. CBC- WBC 15.7, Hgb 10.1/Hct 26.9. LDH WNL. CT cervical spine- basilar vegetation with laxity of transverse ligament of C1. Multilevel disc and facet degeneration, negative for fracture. Noncontrast head CT- atrophy and chronic microvascular ischemia. No acute intracranial abnormality. U/a- moderate hgb, (RBC 7-10/hpf), small leukocytes, no nitrites. Portable abdomen- slight gaseous distention of the stomach, faint bibasilar pulmonary infiltrates, largge irregular calcification in pelvis concerning for represent an unusual large bladder stone. CXR mid R basilar infiltrate. He was admitted by the hospitalist service.   Problem List: Past Medical History  Diagnosis Date  . Osteoarthrosis, unspecified whether generalized or localized, unspecified site   . Gout, unspecified   . Obesity, unspecified   . Pure hypercholesterolemia   . Down syndrome   . Dyspepsia   . Constipation     History reviewed. No pertinent past surgical history.   Allergies: No Known Allergies  Home Medications: Prior to Admission medications   Not on File    Inpatient Medications:  . ciprofloxacin  400  mg Intravenous Q12H  . heparin  5,000 Units Subcutaneous Q8H  . metronidazole  500 mg Intravenous Q6H  . pantoprazole (PROTONIX) IV  40 mg Intravenous QHS  . potassium chloride  10 mEq Intravenous Q1 Hr x 4   No prescriptions prior to admission    History reviewed. No pertinent family history.   History   Social History  . Marital Status: Single    Spouse Name: N/A    Number of Children: N/A  . Years of Education: N/A   Occupational History  . Not on file.   Social History Main Topics  . Smoking status: Never Smoker   . Smokeless tobacco: Not on file  . Alcohol Use: Not on file  . Drug Use: Not on file  . Sexually Active: Not on file   Other Topics Concern  . Not on file   Social History Narrative  . No narrative on file     Review of Systems: Not obtainable  Physical Exam: Blood pressure 89/52, pulse 48, temperature 97.7 F (36.5 C), temperature source Axillary, resp. rate 15, SpO2 100.00%.   General: Downs facies, NAD Neck:  Negative for carotid bruits. JVD not elevated. Lungs:  Clear bilaterally to auscultation without wheezes, rales, or rhonchi. Breathing is unlabored. Heart: Mildly bradycardic, RRR with S1 S2. No murmurs, rubs, or gallops appreciated. Abdomen:  Soft, non-tender, non-distended with normoactive bowel sounds. No hepatomegaly. No rebound/guarding. No obvious abdominal masses. Extremities:  No clubbing, cyanosis or edema.  Distal pedal pulses are 2+ and equal bilaterally. Neuro: Awake, alert, will not answer questions.   Labs: Recent Labs     07/30/13  1315  WBC  15.7*  HGB  10.1*  HCT  26.9*  MCV  90.6  PLT  209   No results found for this basename: VITAMINB12, FOLATE, FERRITIN, TIBC, IRON, RETICCTPCT,  in the last 72 hours No results found for this basename: DDIMER,  in the last 72 hours  Recent Labs Lab 07/30/13 1315  NA 146*  K <2.0*  CL 104  CO2 30  BUN 25*  CREATININE 2.17*  CALCIUM 7.6*  PROT 6.1  BILITOT 1.0  ALKPHOS  77  ALT 6  AST 15  LIPASE 16  GLUCOSE 94   No results found for this basename: HGBA1C,  in the last 72 hours No results found for this basename: CKTOTAL, CKMB, CKMBINDEX, TROPONINI,  in the last 72 hours No components found with this basename: POCBNP,  No results found for this basename: CHOL, HDL, LDLCALC, TRIG, CHOLHDL, LDLDIRECT,  in the last 72 hours No results found for this basename: TSH, T4TOTAL, FREET3, T3FREE, THYROIDAB,  in the last 72 hours  Radiology/Studies: Ct Head Wo Contrast  07/30/2013   *RADIOLOGY REPORT*  Clinical Data:  Fall  CT HEAD WITHOUT CONTRAST CT CERVICAL SPINE WITHOUT CONTRAST  Technique:  Multidetector CT imaging of the head and cervical spine was performed following the standard protocol without intravenous contrast.  Multiplanar CT image reconstructions of the cervical spine were also generated.  Comparison:   None  CT HEAD  Findings: Moderate atrophy.  Mild chronic microvascular ischemic change in the white matter.  No acute infarct, hemorrhage, or mass lesion.  Negative for skull fracture.  IMPRESSION: Atrophy and chronic microvascular ischemia.  No acute intracranial abnormality.  CT CERVICAL SPINE  Findings: There is transverse ligament laxity of C1 with widening of the atlanto dens interval and mild basilar invagination.  Advanced disc degeneration with disc space narrowing and spurring C3-C7.  Bilateral facet degeneration C3-4 and C4-5 and C5-6.  Negative for fracture.  IMPRESSION: Basilar vegetation with laxity of the  transverse ligament of C1.  Multilevel disc and facet degeneration.  Negative for fracture.   Original Report Authenticated By: Janeece Riggers, M.D.   Ct Cervical Spine Wo Contrast  07/30/2013   *RADIOLOGY REPORT*  Clinical Data:  Fall  CT HEAD WITHOUT CONTRAST CT CERVICAL SPINE WITHOUT CONTRAST  Technique:  Multidetector CT imaging of the head and cervical spine was performed following the standard protocol without intravenous contrast.  Multiplanar CT  image reconstructions of the cervical spine were also generated.  Comparison:   None  CT HEAD  Findings: Moderate atrophy.  Mild chronic microvascular ischemic change in the white matter.  No acute infarct, hemorrhage, or mass lesion.  Negative for skull fracture.  IMPRESSION: Atrophy and chronic microvascular ischemia.  No acute intracranial abnormality.  CT CERVICAL SPINE  Findings: There is transverse ligament laxity of C1 with widening of the atlanto dens interval and mild basilar invagination.  Advanced disc degeneration with disc space narrowing and spurring C3-C7.  Bilateral facet degeneration C3-4 and C4-5 and C5-6.  Negative for fracture.  IMPRESSION: Basilar vegetation with laxity of the  transverse ligament of C1.  Multilevel disc and facet degeneration.  Negative for fracture.   Original Report Authenticated By: Janeece Riggers, M.D.   Dg Chest Port 1 View  07/30/2013   *RADIOLOGY REPORT*  Clinical Data: Vomiting  PORTABLE CHEST - 1 VIEW  Comparison: None.  Findings: The heart and pulmonary vascularity are within normal limits.  The lungs are well-aerated bilaterally.  Mild right basilar infiltrate is noted.  This could be related to aspiration pneumonia.  No bony abnormality is seen.  IMPRESSION: Mild right basilar infiltrate.   Original Report Authenticated By: Alcide Clever, M.D.   Dg Abd Portable 1v  07/30/2013   *RADIOLOGY REPORT*  Clinical Data: Nausea and vomiting.  PORTABLE ABDOMEN - 1 VIEW  Comparison: 11/22/2018  Findings: There is slight gaseous distention of the stomach.  There is only a minimal amount of air scattered throughout the nondistended large and small bowel.  There appears to be a faint infiltrate at the right lung base there is also vague density at the left lung base which may represent a small infiltrate.  There is a dense 6 cm irregular calcification in the pelvis of unknown origin. There is a similar density seen in the pelvis with prior radiograph of 11/22/2008.  This could be  associated with the bladder.  CT scan may better define the etiology of this abnormality.  IMPRESSION:  1.  Slight gaseous distention of the stomach. 2.  Faint bibasilar pulmonary infiltrates. 3. Large irregular calcification in the pelvis.  This might represent an unusual large bladder stone.   Original Report Authenticated By: Francene Boyers, M.D.    EKG: NSR at 33, prolonged QT interval  ASSESSMENT AND PLAN:  See attending thoughts below.  Signed, R. Hurman Horn, PA-C 07/30/2013, 5:13 PM  Patient seen with PA, agree with the above note.  57 yo with history of Downs Syndrome presented with a diarrheal illness (nausea, vomiting, diarrhea) and hypotension/dehydration.  Initial K < 2.0.  HR in the low 30s initially, sinus rhythm.  With potassium repletion and IV fluid, HR now up to the upper 40s-50s.  I suspect that the patient's rhythm disturbance is related to his profound hypokalemia in the setting of diarrhea and vomiting.  - Replete K, being careful to replete magnesium simultaneously.  Will check magnesium level.  - Treat hypotension with IV fluid for now.   - Will get echo.  - HR now in the upper 40s-50s, so hopefully electrolyte correction will solve the rhythm problem.   Marca Ancona 07/30/2013 5:56 PM

## 2013-07-30 NOTE — ED Provider Notes (Addendum)
Medical screening examination/treatment/procedure(s) were conducted as a shared visit with non-physician practitioner(s) and myself.  I personally evaluated the patient during the encounter  Pt with Down Syndrome, several days of vomiting. Profound dehydration with electrolyte abnormalities, acute renal failure. Hypotension improving with IVF but still borderline low.   Charles B. Bernette Mayers, MD 07/30/13 1504  Pt now also having bradycardic episodes. Repeat EKG shows ?junctional rhythm. Hospitalist has seen the patient and discussed with PCCM who will take over care. Pt has gotten IV and PO K.   Charles B. Bernette Mayers, MD 07/30/13 [phone]

## 2013-07-30 NOTE — ED Notes (Signed)
Per EMS, family reported pt had nausea, vomiting and diarrhea since yesterday.  Pt was found to be bradycardic; unknown history by family, unknown medication by family.  PIV placed, approximately 200 mL NS infused PTA.

## 2013-07-31 DIAGNOSIS — I319 Disease of pericardium, unspecified: Secondary | ICD-10-CM

## 2013-07-31 LAB — BASIC METABOLIC PANEL
BUN: 24 mg/dL — ABNORMAL HIGH (ref 6–23)
CO2: 27 mEq/L (ref 19–32)
CO2: 23 mEq/L (ref 19–32)
Calcium: 7.2 mg/dL — ABNORMAL LOW (ref 8.4–10.5)
Calcium: 7.1 mg/dL — ABNORMAL LOW (ref 8.4–10.5)
Chloride: 104 mEq/L (ref 96–112)
Creatinine, Ser: 1.89 mg/dL — ABNORMAL HIGH (ref 0.50–1.35)
Glucose, Bld: 73 mg/dL (ref 70–99)
Potassium: 2.4 mEq/L — CL (ref 3.5–5.1)
Sodium: 144 mEq/L (ref 135–145)

## 2013-07-31 LAB — CBC
Hemoglobin: 11 g/dL — ABNORMAL LOW (ref 13.0–17.0)
MCH: 34.2 pg — ABNORMAL HIGH (ref 26.0–34.0)
MCHC: 37.7 g/dL — ABNORMAL HIGH (ref 30.0–36.0)
MCV: 90.7 fL (ref 78.0–100.0)
RBC: 3.22 MIL/uL — ABNORMAL LOW (ref 4.22–5.81)

## 2013-07-31 LAB — PHOSPHORUS: Phosphorus: 4.3 mg/dL (ref 2.3–4.6)

## 2013-07-31 LAB — MAGNESIUM: Magnesium: 2.5 mg/dL (ref 1.5–2.5)

## 2013-07-31 MED ORDER — LACTATED RINGERS IV BOLUS (SEPSIS)
1000.0000 mL | Freq: Once | INTRAVENOUS | Status: AC
  Administered 2013-07-31: 1000 mL via INTRAVENOUS

## 2013-07-31 MED ORDER — POTASSIUM PHOSPHATE DIBASIC 3 MMOLE/ML IV SOLN
40.0000 meq | Freq: Once | INTRAVENOUS | Status: AC
  Administered 2013-07-31: 40 meq via INTRAVENOUS
  Filled 2013-07-31: qty 9.09

## 2013-07-31 MED ORDER — POTASSIUM CHLORIDE 10 MEQ/100ML IV SOLN
10.0000 meq | INTRAVENOUS | Status: AC
  Administered 2013-07-31 (×6): 10 meq via INTRAVENOUS
  Filled 2013-07-31: qty 400
  Filled 2013-07-31: qty 200
  Filled 2013-07-31: qty 100

## 2013-07-31 MED ORDER — DEXTROSE-NACL 5-0.9 % IV SOLN
INTRAVENOUS | Status: DC
  Administered 2013-07-31 (×2): via INTRAVENOUS

## 2013-07-31 MED ORDER — POTASSIUM CHLORIDE 10 MEQ/100ML IV SOLN
10.0000 meq | INTRAVENOUS | Status: AC
  Administered 2013-07-31 (×7): 10 meq via INTRAVENOUS
  Filled 2013-07-31 (×2): qty 300

## 2013-07-31 NOTE — Progress Notes (Signed)
CRITICAL VALUE ALERT  Critical value received: K=<2.0  Date of notification:  07/31/2013  Time of notification:  05:18 AM  Critical value read back:yes  Nurse who received alert:  Sander Radon RN  MD notified (1st page):  Dr. Belinda Block  Time of first page:  05:18 AM  MD notified (2nd page):  Time of second page:  Responding MD:  Dr. Belinda Block  Time MD responded:  05:20 AM

## 2013-07-31 NOTE — Progress Notes (Signed)
Patient ID: Richard Greene, male   DOB: 1956/12/18, 57 y.o.   MRN: [id number] Subjective:  Awake but minimally responsive. Shakes his head and answers no to every question.  Objective:  Vital Signs in the last 24 hours: Temp:  [97.5 F (36.4 C)-97.7 F (36.5 C)] 97.5 F (36.4 C) (08/02 0413) Pulse Rate:  [37-65] 38 (08/02 0600) Resp:  [10-20] 10 (08/02 0600) BP: (79-118)/(34-96) 81/42 mmHg (08/02 0600) SpO2:  [87 %-100 %] 99 % (08/02 0600) Weight:  [108 lb 3.9 oz (49.1 kg)-112 lb 10.5 oz (51.1 kg)] 112 lb 10.5 oz (51.1 kg) (08/02 0500)  Intake/Output from previous day: 08/01 0701 - 08/02 0700 In: 1885 [I.V.:1300; IV Piggyback:585] Out: -  Intake/Output from this shift:    Physical Exam: Down's facies but otherwise stable appearing NAD HEENT: Unremarkable Neck:  No JVD, no thyromegally Back:  No CVA tenderness Lungs:  Clear with no wheezes HEART:  Regular rate rhythm, no murmurs, no rubs, no clicks Abd:  Flat, positive bowel sounds, no organomegally, no rebound, no guarding Ext:  2 plus pulses, no edema, no cyanosis, no clubbing Skin:  No rashes no nodules Neuro:  CN II through XII intact, motor grossly intact  Lab Results:  Recent Labs  07/30/13 1651 07/31/13 0420  WBC 15.9* 14.0*  HGB 10.1* 11.0*  PLT 214 189    Recent Labs  07/30/13 1843 07/31/13 0420  NA 144 143  K 2.0* <2.0*  CL 105 105  CO2 27 27  GLUCOSE 106* 73  BUN 24* 24*  CREATININE 1.90* 1.89*   No results found for this basename: TROPONINI, CK, MB,  in the last 72 hours Hepatic Function Panel  Recent Labs  07/30/13 1315  PROT 6.1  ALBUMIN 2.2*  AST 15  ALT 6  ALKPHOS 77  BILITOT 1.0   No results found for this basename: CHOL,  in the last 72 hours No results found for this basename: PROTIME,  in the last 72 hours  Imaging: Ct Head Wo Contrast  07/30/2013   *RADIOLOGY REPORT*  Clinical Data:  Fall  CT HEAD WITHOUT CONTRAST CT CERVICAL SPINE WITHOUT CONTRAST  Technique:  Multidetector  CT imaging of the head and cervical spine was performed following the standard protocol without intravenous contrast.  Multiplanar CT image reconstructions of the cervical spine were also generated.  Comparison:   None  CT HEAD  Findings: Moderate atrophy.  Mild chronic microvascular ischemic change in the white matter.  No acute infarct, hemorrhage, or mass lesion.  Negative for skull fracture.  IMPRESSION: Atrophy and chronic microvascular ischemia.  No acute intracranial abnormality.  CT CERVICAL SPINE  Findings: There is transverse ligament laxity of C1 with widening of the atlanto dens interval and mild basilar invagination.  Advanced disc degeneration with disc space narrowing and spurring C3-C7.  Bilateral facet degeneration C3-4 and C4-5 and C5-6.  Negative for fracture.  IMPRESSION: Basilar vegetation with laxity of the  transverse ligament of C1.  Multilevel disc and facet degeneration.  Negative for fracture.   Original Report Authenticated By: Janeece Riggers, M.D.   Ct Cervical Spine Wo Contrast  07/30/2013   *RADIOLOGY REPORT*  Clinical Data:  Fall  CT HEAD WITHOUT CONTRAST CT CERVICAL SPINE WITHOUT CONTRAST  Technique:  Multidetector CT imaging of the head and cervical spine was performed following the standard protocol without intravenous contrast.  Multiplanar CT image reconstructions of the cervical spine were also generated.  Comparison:   None  CT HEAD  Findings:  Moderate atrophy.  Mild chronic microvascular ischemic change in the white matter.  No acute infarct, hemorrhage, or mass lesion.  Negative for skull fracture.  IMPRESSION: Atrophy and chronic microvascular ischemia.  No acute intracranial abnormality.  CT CERVICAL SPINE  Findings: There is transverse ligament laxity of C1 with widening of the atlanto dens interval and mild basilar invagination.  Advanced disc degeneration with disc space narrowing and spurring C3-C7.  Bilateral facet degeneration C3-4 and C4-5 and C5-6.  Negative for  fracture.  IMPRESSION: Basilar vegetation with laxity of the  transverse ligament of C1.  Multilevel disc and facet degeneration.  Negative for fracture.   Original Report Authenticated By: Janeece Riggers, M.D.   Dg Chest Port 1 View  07/30/2013   *RADIOLOGY REPORT*  Clinical Data: Vomiting  PORTABLE CHEST - 1 VIEW  Comparison: None.  Findings: The heart and pulmonary vascularity are within normal limits.  The lungs are well-aerated bilaterally.  Mild right basilar infiltrate is noted.  This could be related to aspiration pneumonia.  No bony abnormality is seen.  IMPRESSION: Mild right basilar infiltrate.   Original Report Authenticated By: Alcide Clever, M.D.   Dg Abd Portable 1v  07/30/2013   *RADIOLOGY REPORT*  Clinical Data: Nausea and vomiting.  PORTABLE ABDOMEN - 1 VIEW  Comparison: 11/22/2018  Findings: There is slight gaseous distention of the stomach.  There is only a minimal amount of air scattered throughout the nondistended large and small bowel.  There appears to be a faint infiltrate at the right lung base there is also vague density at the left lung base which may represent a small infiltrate.  There is a dense 6 cm irregular calcification in the pelvis of unknown origin. There is a similar density seen in the pelvis with prior radiograph of 11/22/2008.  This could be associated with the bladder.  CT scan may better define the etiology of this abnormality.  IMPRESSION:  1.  Slight gaseous distention of the stomach. 2.  Faint bibasilar pulmonary infiltrates. 3. Large irregular calcification in the pelvis.  This might represent an unusual large bladder stone.   Original Report Authenticated By: Francene Boyers, M.D.    Cardiac Studies: Tele - sinus brady Assessment/Plan:  1. Sinus bradycardia 2. Hypokalemia causing #1 3. Diarrhea, presumably causing #2 4. Down's syndrome 5. Severe cervical spine disease Rec: replete potassium. His bradycardia should improve once potassium above 3.5. Will review  echo after complete. I doubt we will have much more to add. He will of course need magnesium as well.  LOS: 1 day    Gerhard Munch 07/31/2013, 7:36 AM

## 2013-07-31 NOTE — Progress Notes (Signed)
eLink Nursing ICU Electrolyte Replacement Protocol  Patient Name: CLEVE PAOLILLO DOB: [birth year] MRN: [id number]  Date of Service  07/31/2013   HPI/Events of Note    Recent Labs Lab 07/30/13 1315 07/30/13 1651 07/30/13 1843 07/31/13 0420  NA 146* 145 144 143  K <2.0* <2.0* 2.0* <2.0*  CL 104 104 105 105  CO2 30 28 27 27   GLUCOSE 94 89 106* 73  BUN 25* 25* 24* 24*  CREATININE 2.17* 2.04*  2.03* 1.90* 1.89*  CALCIUM 7.6* 7.5* 7.3* 7.2*  MG  --   --  2.8* 2.7*  PHOS  --   --   --  1.8*    Estimated Creatinine Clearance: 31.2 ml/min (by C-G formula based on Cr of 1.89).  Intake/Output     08/01 0701 - 08/02 0700   I.V. (mL/kg) 1300 (25.4)   IV Piggyback 400   Total Intake(mL/kg) 1700 (33.3)   Net +1700        - I/O DETAILED x24h    Total I/O In: 1100 [I.V.:1000; IV Piggyback:100] Out: -  - I/O THIS SHIFT    ASSESSMENT   eICURN Interventions  K+ < 2  Phos 1.8 Electrolytes treated by MD   ASSESSMENT: MAJOR ELECTROLYTE    Merita Norton 07/31/2013, 6:07 AM

## 2013-07-31 NOTE — Progress Notes (Signed)
PULMONARY  / CRITICAL CARE MEDICINE  Name: Richard Greene MRN: [id number] DOB: 1956/12/23    ADMISSION DATE:  07/30/2013  REFERRING MD :  EDP - Dr. Bernette Mayers  PRIMARY SERVICE: PCCM  CHIEF COMPLAINT:  Bradycardia, Dehydration  BRIEF PATIENT DESCRIPTION: 57 y/o M, with down syndrome presented to [facility] ER on 8/1 with N/V/D x48 hours.  PCCM consulted for assistance with care per [facility].  SIGNIFICANT EVENTS / STUDIES:  8/1 - Admit with N/V/D, found to have bradycardia, profound hypokalemia  LINES / TUBES: PIV   CULTURES: 8/1 Stool>>> 8/1 Urine >>>   ANTIBIOTICS: Cipro 8/1>>> Flagyl 8/1>>>  SUBJECTIVE:  Sleepy.  VITAL SIGNS: Temp:  [97.5 F (36.4 C)-97.7 F (36.5 C)] 97.5 F (36.4 C) (08/02 0413) Pulse Rate:  [37-65] 38 (08/02 0600) Resp:  [10-20] 10 (08/02 0600) BP: (79-118)/(34-96) 81/42 mmHg (08/02 0600) SpO2:  [87 %-100 %] 99 % (08/02 0600) Weight:  [108 lb 3.9 oz (49.1 kg)-112 lb 10.5 oz (51.1 kg)] 112 lb 10.5 oz (51.1 kg) (08/02 0500) Room air  INTAKE / OUTPUT: Intake/Output     08/01 0701 - 08/02 0700 08/02 0701 - 08/03 0700   I.V. (mL/kg) 1300 (25.4)    IV Piggyback 585    Total Intake(mL/kg) 1885 (36.9)    Net +1885          Urine Occurrence 2 x    Stool Occurrence 1 x      PHYSICAL EXAMINATION: General:  Chronically ill, down syndrome Neuro:  Awake, alert, MAE  HEENT:  Mm pale / dry Cardiovascular:  s1s2 rrr, brady Lungs:  resp's even/non-labored / shallow, lungs bilaterally clear Abdomen:  Round/soft, bsx4 active Musculoskeletal:  No acute deformities Skin:  Dry, pale, no edema  LABS:  CBC Recent Labs     07/30/13  1315  07/30/13  1651  07/31/13  0420  WBC  15.7*  15.9*  14.0*  HGB  10.1*  10.1*  11.0*  HCT  26.9*  26.8*  29.2*  PLT  209  214  189   BMET Recent Labs     07/30/13  1651  07/30/13  1843  07/31/13  0420  NA  145  144  143  K  <2.0*  2.0*  <2.0*  CL  104  105  105  CO2  28  27  27   BUN  25*  24*  24*  CREATININE  2.04*   2.03*  1.90*  1.89*  GLUCOSE  89  106*  73   Electrolytes Recent Labs     07/30/13  1651  07/30/13  1843  07/31/13  0420  CALCIUM  7.5*  7.3*  7.2*  MG   --   2.8*  2.7*  PHOS   --    --   1.8*   Liver Enzymes Recent Labs     07/30/13  1315  AST  15  ALT  6  ALKPHOS  77  BILITOT  1.0  ALBUMIN  2.2*   Glucose Recent Labs     07/30/13  1240  07/30/13  1451  GLUCAP  90  84    Imaging Ct Head Wo Contrast  07/30/2013   *RADIOLOGY REPORT*  Clinical Data:  Fall  CT HEAD WITHOUT CONTRAST CT CERVICAL SPINE WITHOUT CONTRAST  Technique:  Multidetector CT imaging of the head and cervical spine was performed following the standard protocol without intravenous contrast.  Multiplanar CT image reconstructions of the cervical spine were also generated.  Comparison:  None  CT HEAD  Findings: Moderate atrophy.  Mild chronic microvascular ischemic change in the white matter.  No acute infarct, hemorrhage, or mass lesion.  Negative for skull fracture.  IMPRESSION: Atrophy and chronic microvascular ischemia.  No acute intracranial abnormality.  CT CERVICAL SPINE  Findings: There is transverse ligament laxity of C1 with widening of the atlanto dens interval and mild basilar invagination.  Advanced disc degeneration with disc space narrowing and spurring C3-C7.  Bilateral facet degeneration C3-4 and C4-5 and C5-6.  Negative for fracture.  IMPRESSION: Basilar vegetation with laxity of the  transverse ligament of C1.  Multilevel disc and facet degeneration.  Negative for fracture.   Original Report Authenticated By: Janeece Riggers, M.D.   Ct Cervical Spine Wo Contrast  07/30/2013   *RADIOLOGY REPORT*  Clinical Data:  Fall  CT HEAD WITHOUT CONTRAST CT CERVICAL SPINE WITHOUT CONTRAST  Technique:  Multidetector CT imaging of the head and cervical spine was performed following the standard protocol without intravenous contrast.  Multiplanar CT image reconstructions of the cervical spine were also generated.   Comparison:   None  CT HEAD  Findings: Moderate atrophy.  Mild chronic microvascular ischemic change in the white matter.  No acute infarct, hemorrhage, or mass lesion.  Negative for skull fracture.  IMPRESSION: Atrophy and chronic microvascular ischemia.  No acute intracranial abnormality.  CT CERVICAL SPINE  Findings: There is transverse ligament laxity of C1 with widening of the atlanto dens interval and mild basilar invagination.  Advanced disc degeneration with disc space narrowing and spurring C3-C7.  Bilateral facet degeneration C3-4 and C4-5 and C5-6.  Negative for fracture.  IMPRESSION: Basilar vegetation with laxity of the  transverse ligament of C1.  Multilevel disc and facet degeneration.  Negative for fracture.   Original Report Authenticated By: Janeece Riggers, M.D.   Dg Chest Port 1 View  07/30/2013   *RADIOLOGY REPORT*  Clinical Data: Vomiting  PORTABLE CHEST - 1 VIEW  Comparison: None.  Findings: The heart and pulmonary vascularity are within normal limits.  The lungs are well-aerated bilaterally.  Mild right basilar infiltrate is noted.  This could be related to aspiration pneumonia.  No bony abnormality is seen.  IMPRESSION: Mild right basilar infiltrate.   Original Report Authenticated By: Alcide Clever, M.D.   Dg Abd Portable 1v  07/30/2013   *RADIOLOGY REPORT*  Clinical Data: Nausea and vomiting.  PORTABLE ABDOMEN - 1 VIEW  Comparison: 11/22/2018  Findings: There is slight gaseous distention of the stomach.  There is only a minimal amount of air scattered throughout the nondistended large and small bowel.  There appears to be a faint infiltrate at the right lung base there is also vague density at the left lung base which may represent a small infiltrate.  There is a dense 6 cm irregular calcification in the pelvis of unknown origin. There is a similar density seen in the pelvis with prior radiograph of 11/22/2008.  This could be associated with the bladder.  CT scan may better define the  etiology of this abnormality.  IMPRESSION:  1.  Slight gaseous distention of the stomach. 2.  Faint bibasilar pulmonary infiltrates. 3. Large irregular calcification in the pelvis.  This might represent an unusual large bladder stone.   Original Report Authenticated By: Francene Boyers, M.D.    ASSESSMENT / PLAN:  PULMONARY A: At Risk Aspiration P:   -monitor  CARDIOVASCULAR A:  Bradycardia, likely secondary to profound hypokalemia Hypotension Hypercholesterolemia P:  -continue volume resuscitation -monitor heart  rate while replacing electrolytes -continue stress dose steroids for now  RENAL A:   Hypokalemia, hypomagnesemia >> likely from GI losses Acute Kidney Injury - in setting of vomiting / dehydration P:   -monitor renal fx, urine outpt -f/u and replace electrolytes as needed -change to NS IV fluid in setting of hypotension  GASTROINTESTINAL A:   Nausea / Vomiting Diarrhea Obesity R/o gastroenteritis P:   -NPO for now -prn zofran  HEMATOLOGIC A:   Anemia  P:  -f/u CBC  INFECTIOUS A:   Gastroenteritis P:   -D 2 cipro, flagyl  ENDOCRINE A:  Relative adrenal insufficiency P:   -stress streroids  NEUROLOGIC A:   Down Syndrome  P:   -monitor  / supportive care   Updated family at bedside.  CC time 35 minutes.  Coralyn Helling, MD [facility] Pulmonary/Critical Care 07/31/2013, 7:23 AM Pager:  [phone] After 3pm call: [phone]

## 2013-07-31 NOTE — Progress Notes (Signed)
eLink Physician-Brief Progress Note Patient Name: Richard Greene DOB: 1956-08-26 MRN: [id number]  Date of Service  07/31/2013   HPI/Events of Note   K 2.4 Cr 1.7 NPO  eICU Interventions   KCl 10 x 6   Intervention Category Major Interventions: Electrolyte abnormality - evaluation and management  [name] 07/31/2013, 4:09 PM

## 2013-07-31 NOTE — Progress Notes (Signed)
  Echocardiogram 2D Echocardiogram has been performed.  Richard Greene 07/31/2013, 11:24 AM 

## 2013-07-31 NOTE — Progress Notes (Signed)
eLink Physician-Brief Progress Note Patient Name: Richard Greene DOB: 1956-08-06 MRN: [id number]  Date of Service  07/31/2013   HPI/Events of Note   Potassium less than 2   eICU Interventions  Ordered KCL x8   Intervention Category Major Interventions: Electrolyte abnormality - evaluation and management  Richard Greene K. 07/31/2013, 5:21 AM

## 2013-08-01 DIAGNOSIS — M7989 Other specified soft tissue disorders: Secondary | ICD-10-CM

## 2013-08-01 LAB — URINE CULTURE: Colony Count: >=100000

## 2013-08-01 LAB — BASIC METABOLIC PANEL
CO2: 21 mEq/L (ref 19–32)
Chloride: 104 mEq/L (ref 96–112)
Chloride: 107 mEq/L (ref 96–112)
Creatinine, Ser: 1.74 mg/dL — ABNORMAL HIGH (ref 0.50–1.35)
Creatinine, Ser: 1.53 mg/dL — ABNORMAL HIGH (ref 0.50–1.35)
GFR calc Af Amer: 49 mL/min — ABNORMAL LOW (ref >90)
Potassium: 2.1 mEq/L — CL (ref 3.5–5.1)
Potassium: 2.9 mEq/L — ABNORMAL LOW (ref 3.5–5.1)
Sodium: 143 mEq/L (ref 135–145)

## 2013-08-01 LAB — MAGNESIUM: Magnesium: 2.4 mg/dL (ref 1.5–2.5)

## 2013-08-01 LAB — CBC
MCH: 34 pg (ref 26.0–34.0)
Platelets: 182 10*3/uL (ref 150–400)
RBC: 3.29 MIL/uL — ABNORMAL LOW (ref 4.22–5.81)

## 2013-08-01 LAB — PHOSPHORUS: Phosphorus: 3.5 mg/dL (ref 2.3–4.6)

## 2013-08-01 MED ORDER — KCL IN DEXTROSE-NACL 40-5-0.45 MEQ/L-%-% IV SOLN
INTRAVENOUS | Status: DC
  Administered 2013-08-01 – 2013-08-02 (×2): via INTRAVENOUS
  Filled 2013-08-01 (×5): qty 1000

## 2013-08-01 MED ORDER — POTASSIUM CHLORIDE CRYS ER 20 MEQ PO TBCR
40.0000 meq | EXTENDED_RELEASE_TABLET | ORAL | Status: AC
  Administered 2013-08-01 (×3): 40 meq via ORAL
  Filled 2013-08-01 (×3): qty 2

## 2013-08-01 MED ORDER — POTASSIUM CHLORIDE 10 MEQ/100ML IV SOLN
10.0000 meq | INTRAVENOUS | Status: AC
  Administered 2013-08-01 (×6): 10 meq via INTRAVENOUS
  Filled 2013-08-01: qty 600

## 2013-08-01 MED ORDER — HYDROCORTISONE SOD SUCCINATE 100 MG IJ SOLR
50.0000 mg | Freq: Two times a day (BID) | INTRAMUSCULAR | Status: DC
  Administered 2013-08-01 – 2013-08-03 (×4): 50 mg via INTRAVENOUS
  Filled 2013-08-01 (×8): qty 1

## 2013-08-01 NOTE — Progress Notes (Signed)
PULMONARY  / CRITICAL CARE MEDICINE  Name: Richard Greene MRN: [id number] DOB: [birth year]    ADMISSION DATE:  07/30/2013  REFERRING MD :  EDP - Dr. Bernette Mayers  PRIMARY SERVICE: PCCM  CHIEF COMPLAINT:  Bradycardia, Dehydration  BRIEF PATIENT DESCRIPTION: 57 y/o M, with down syndrome presented to [facility] ER on 8/1 with N/V/D x48 hours.  PCCM consulted for assistance with care per [facility].  SIGNIFICANT EVENTS / STUDIES:  8/1 - Admit with N/V/D, found to have bradycardia, profound hypokalemia 8/2 - Echo >> EF 55 to 60%  LINES / TUBES: PIV   CULTURES: 8/1 Stool>>> 8/1 Urine >>> E coli   ANTIBIOTICS: Cipro 8/1>>> Flagyl 8/1>>>8/3  SUBJECTIVE:  More alert.  VITAL SIGNS: Temp:  [97.3 F (36.3 C)-98.2 F (36.8 C)] 98 F (36.7 C) (08/03 0700) Pulse Rate:  [50-56] 54 (08/03 0700) Resp:  [13-22] 14 (08/03 0700) BP: (71-110)/(33-91) 110/54 mmHg (08/03 0700) SpO2:  [92 %-100 %] 98 % (08/03 0700) Weight:  [121 lb 4.1 oz (55 kg)] 121 lb 4.1 oz (55 kg) (08/03 0500) Room air  INTAKE / OUTPUT: Intake/Output     08/02 0701 - 08/03 0700 08/03 0701 - 08/04 0700   I.V. (mL/kg) 1798.3 (32.7)    IV Piggyback 2300    Total Intake(mL/kg) 4098.3 (74.5)    Urine (mL/kg/hr) 200 (0.2)    Total Output 200     Net +3898.3          Urine Occurrence 8 x    Stool Occurrence 4 x      PHYSICAL EXAMINATION: General:  Chronically ill, down syndrome Neuro:  Awake, alert, MAE  HEENT:  Mm pale / dry Cardiovascular:  s1s2 rrr, brady Lungs:  resp's even/non-labored / shallow, lungs bilaterally clear Abdomen:  Round/soft, bsx4 active Musculoskeletal:  No acute deformities Skin:  Dry, pale, no edema  LABS:  CBC Recent Labs     07/30/13  1651  07/31/13  0420  08/01/13  0143  WBC  15.9*  14.0*  11.8*  HGB  10.1*  11.0*  11.2*  HCT  26.8*  29.2*  29.3*  PLT  214  189  182   BMET Recent Labs     07/31/13  0420  07/31/13  1516  08/01/13  0143  NA  143  144  143  K  <2.0*  2.4*  2.1*  CL  105   104  104  CO2  27  23  26   BUN  24*  22  19  CREATININE  1.89*  1.76*  1.74*  GLUCOSE  73  95  111*   Electrolytes Recent Labs     07/31/13  0420  07/31/13  1516  08/01/13  0143  CALCIUM  7.2*  7.1*  6.8*  MG  2.7*  2.5  2.4  PHOS  1.8*  4.3  3.5   Liver Enzymes Recent Labs     07/30/13  1315  AST  15  ALT  6  ALKPHOS  77  BILITOT  1.0  ALBUMIN  2.2*   Glucose Recent Labs     07/30/13  1240  07/30/13  1451  GLUCAP  90  84    Imaging Ct Head Wo Contrast  07/30/2013   *RADIOLOGY REPORT*  Clinical Data:  Fall  CT HEAD WITHOUT CONTRAST CT CERVICAL SPINE WITHOUT CONTRAST  Technique:  Multidetector CT imaging of the head and cervical spine was performed following the standard protocol without intravenous contrast.  Multiplanar CT  image reconstructions of the cervical spine were also generated.  Comparison:   None  CT HEAD  Findings: Moderate atrophy.  Mild chronic microvascular ischemic change in the white matter.  No acute infarct, hemorrhage, or mass lesion.  Negative for skull fracture.  IMPRESSION: Atrophy and chronic microvascular ischemia.  No acute intracranial abnormality.  CT CERVICAL SPINE  Findings: There is transverse ligament laxity of C1 with widening of the atlanto dens interval and mild basilar invagination.  Advanced disc degeneration with disc space narrowing and spurring C3-C7.  Bilateral facet degeneration C3-4 and C4-5 and C5-6.  Negative for fracture.  IMPRESSION: Basilar vegetation with laxity of the  transverse ligament of C1.  Multilevel disc and facet degeneration.  Negative for fracture.   Original Report Authenticated By: Janeece Riggers, M.D.   Ct Cervical Spine Wo Contrast  07/30/2013   *RADIOLOGY REPORT*  Clinical Data:  Fall  CT HEAD WITHOUT CONTRAST CT CERVICAL SPINE WITHOUT CONTRAST  Technique:  Multidetector CT imaging of the head and cervical spine was performed following the standard protocol without intravenous contrast.  Multiplanar CT image  reconstructions of the cervical spine were also generated.  Comparison:   None  CT HEAD  Findings: Moderate atrophy.  Mild chronic microvascular ischemic change in the white matter.  No acute infarct, hemorrhage, or mass lesion.  Negative for skull fracture.  IMPRESSION: Atrophy and chronic microvascular ischemia.  No acute intracranial abnormality.  CT CERVICAL SPINE  Findings: There is transverse ligament laxity of C1 with widening of the atlanto dens interval and mild basilar invagination.  Advanced disc degeneration with disc space narrowing and spurring C3-C7.  Bilateral facet degeneration C3-4 and C4-5 and C5-6.  Negative for fracture.  IMPRESSION: Basilar vegetation with laxity of the  transverse ligament of C1.  Multilevel disc and facet degeneration.  Negative for fracture.   Original Report Authenticated By: Janeece Riggers, M.D.   Dg Chest Port 1 View  07/30/2013   *RADIOLOGY REPORT*  Clinical Data: Vomiting  PORTABLE CHEST - 1 VIEW  Comparison: None.  Findings: The heart and pulmonary vascularity are within normal limits.  The lungs are well-aerated bilaterally.  Mild right basilar infiltrate is noted.  This could be related to aspiration pneumonia.  No bony abnormality is seen.  IMPRESSION: Mild right basilar infiltrate.   Original Report Authenticated By: Alcide Clever, M.D.   Dg Abd Portable 1v  07/30/2013   *RADIOLOGY REPORT*  Clinical Data: Nausea and vomiting.  PORTABLE ABDOMEN - 1 VIEW  Comparison: 11/22/2018  Findings: There is slight gaseous distention of the stomach.  There is only a minimal amount of air scattered throughout the nondistended large and small bowel.  There appears to be a faint infiltrate at the right lung base there is also vague density at the left lung base which may represent a small infiltrate.  There is a dense 6 cm irregular calcification in the pelvis of unknown origin. There is a similar density seen in the pelvis with prior radiograph of 11/22/2008.  This could be  associated with the bladder.  CT scan may better define the etiology of this abnormality.  IMPRESSION:  1.  Slight gaseous distention of the stomach. 2.  Faint bibasilar pulmonary infiltrates. 3. Large irregular calcification in the pelvis.  This might represent an unusual large bladder stone.   Original Report Authenticated By: Francene Boyers, M.D.    ASSESSMENT / PLAN:  PULMONARY A: At Risk Aspiration P:   -monitor  CARDIOVASCULAR A:  Bradycardia, likely  secondary to profound hypokalemia Hypotension >> resolved Hypercholesterolemia P:  -adjust IV fluids -monitor heart rate while replacing electrolytes -start to wean off stress steroids 8/03  RENAL A:   Hypokalemia, hypomagnesemia >> likely from GI losses Acute Kidney Injury - in setting of vomiting / dehydration P:   -monitor renal fx, urine outpt -f/u and replace electrolytes as needed  GASTROINTESTINAL A:   Nausea / Vomiting >> improved Diarrhea >> improved Obesity P:   -full liquid and advance as tolerated -prn zofran  HEMATOLOGIC A:   Anemia  P:  -f/u CBC  INFECTIOUS A:   Gastroenteritis >> resolved E coli UTI P:   -D 3 cipro -d/c flagyl  ENDOCRINE A:  Relative adrenal insufficiency P:   -wean off stress streroids  NEUROLOGIC A:   Down Syndrome  P:   -monitor  / supportive care   Updated family at bedside.   Coralyn Helling, MD [facility] Pulmonary/Critical Care 08/01/2013, 10:26 AM Pager:  [phone] After 3pm call: [phone]

## 2013-08-01 NOTE — Progress Notes (Signed)
eLink Physician-Brief Progress Note Patient Name: TOIVO BORDON DOB: 05/24/1956 MRN: [id number]  Date of Service  08/01/2013   HPI/Events of Note   S/p 6 runs KCl for K = 2.4  eICU Interventions  Repeat BMP now   Intervention Category Intermediate Interventions: Electrolyte abnormality - evaluation and management  Kirill Chatterjee S. 08/01/2013, 1:32 AM

## 2013-08-01 NOTE — Progress Notes (Signed)
VASCULAR LAB PRELIMINARY  PRELIMINARY  PRELIMINARY  PRELIMINARY  Left upper extremity venous Doppler completed.    Preliminary report:  There is no DVT or SVT noted in the right upper extremity.  Yarnell Kozloski, RVT 08/01/2013, 6:12 PM

## 2013-08-01 NOTE — Progress Notes (Signed)
eLink Physician-Brief Progress Note Patient Name: Richard Greene DOB: [birth year] MRN: [id number]  Date of Service  08/01/2013   HPI/Events of Note   Repeat K = 2.1  eICU Interventions  KCl IV ordered   Intervention Category Intermediate Interventions: Electrolyte abnormality - evaluation and management  [name] S. 08/01/2013, 2:32 AM

## 2013-08-01 NOTE — Progress Notes (Signed)
CRITICAL VALUE ALERT  Critical value received:  K 2.1  Date of notification:  08/01/2013  Time of notification:  0230  Critical value read back:yes  Nurse who received alert:  Neville Route, RN  MD notified (1st page):  [place]rum  Time of first page:  0230  Responding MD:  Inis Sizer  Time MD responded:  0230

## 2013-08-01 NOTE — Progress Notes (Signed)
Bilateral mittens removed to advance diet as tolerated. When removed 3+ edema noted in left upper extremity. IV removed, MD notified, new order obtained. Will continue to monitor.

## 2013-08-02 LAB — CBC
HCT: 25.7 % — ABNORMAL LOW (ref 39.0–52.0)
Hemoglobin: 9.5 g/dL — ABNORMAL LOW (ref 13.0–17.0)
MCV: 91.1 fL (ref 78.0–100.0)
RBC: 2.82 MIL/uL — ABNORMAL LOW (ref 4.22–5.81)
RDW: 17.7 % — ABNORMAL HIGH (ref 11.5–15.5)
WBC: 11.3 10*3/uL — ABNORMAL HIGH (ref 4.0–10.5)

## 2013-08-02 LAB — BASIC METABOLIC PANEL
BUN: 14 mg/dL (ref 6–23)
CO2: 24 mEq/L (ref 19–32)
Chloride: 110 mEq/L (ref 96–112)
Creatinine, Ser: 1.52 mg/dL — ABNORMAL HIGH (ref 0.50–1.35)
Glucose, Bld: 143 mg/dL — ABNORMAL HIGH (ref 70–99)
Potassium: 2.3 mEq/L — CL (ref 3.5–5.1)

## 2013-08-02 MED ORDER — POTASSIUM CHLORIDE 10 MEQ/100ML IV SOLN
10.0000 meq | Freq: Once | INTRAVENOUS | Status: AC
  Administered 2013-08-02: 10 meq via INTRAVENOUS
  Filled 2013-08-02: qty 100

## 2013-08-02 MED ORDER — LORAZEPAM 2 MG/ML IJ SOLN
1.0000 mg | Freq: Once | INTRAMUSCULAR | Status: AC
  Administered 2013-08-02: 1 mg via INTRAVENOUS
  Filled 2013-08-02: qty 1

## 2013-08-02 MED ORDER — POTASSIUM CHLORIDE 10 MEQ/100ML IV SOLN
10.0000 meq | INTRAVENOUS | Status: AC
  Administered 2013-08-02 (×5): 10 meq via INTRAVENOUS
  Filled 2013-08-02 (×5): qty 100

## 2013-08-02 MED ORDER — POTASSIUM CHLORIDE CRYS ER 20 MEQ PO TBCR
40.0000 meq | EXTENDED_RELEASE_TABLET | Freq: Three times a day (TID) | ORAL | Status: AC
  Administered 2013-08-02 (×2): 40 meq via ORAL
  Filled 2013-08-02 (×2): qty 2

## 2013-08-02 NOTE — Progress Notes (Signed)
PULMONARY  / CRITICAL CARE MEDICINE  Name: Richard Greene MRN: [id number] DOB: 1955-12-31    ADMISSION DATE:  07/30/2013  REFERRING MD :  EDP - Dr. Bernette Mayers  PRIMARY SERVICE: PCCM  CHIEF COMPLAINT:  Bradycardia, Dehydration  BRIEF PATIENT DESCRIPTION: 57 y/o M, with Richard Greene presented to [facility] ER on 8/1 with N/V/D x48 hours.  PCCM consulted for assistance with care per [facility].  SIGNIFICANT EVENTS / STUDIES:  8/1 - Admit with N/V/D, found to have bradycardia, profound hypokalemia 8/2 - Echo >> EF 55 to 60%  LINES / TUBES: PIV   CULTURES: 8/1 Stool>>> 8/1 Urine >>> E coli   ANTIBIOTICS: Cipro 8/1>>> Flagyl 8/1>>>8/3  SUBJECTIVE:  More alert.  VITAL SIGNS: Temp:  [97.4 F (36.3 C)-98.2 F (36.8 C)] 97.4 F (36.3 C) (08/04 0734) Pulse Rate:  [48-54] 48 (08/03 1600) Resp:  [14-20] 16 (08/04 0800) BP: (84-112)/(52-70) 105/68 mmHg (08/04 0800) SpO2:  [95 %-100 %] 100 % (08/04 0734) Weight:  [54.6 kg (120 lb 5.9 oz)] 54.6 kg (120 lb 5.9 oz) (08/04 0449) Room air  INTAKE / OUTPUT: Intake/Output     08/03 0701 - 08/04 0700 08/04 0701 - 08/05 0700   P.O. 240    I.V. (mL/kg) 1300 (23.8) 50 (0.9)   IV Piggyback 700    Total Intake(mL/kg) 2240 (41) 50 (0.9)   Urine (mL/kg/hr)     Total Output       Net +2240 +50        Urine Occurrence 8 x    Stool Occurrence 8 x      PHYSICAL EXAMINATION: General:  Chronically ill, Richard Greene Neuro:  Awake, alert, MAE  HEENT:  Mm pale / dry Cardiovascular:  s1s2 rrr, brady Lungs:  resp's even/non-labored / shallow, lungs bilaterally clear Abdomen:  Round/soft, bsx4 active Musculoskeletal:  No acute deformities Skin:  Dry, pale, no edema  LABS:  CBC Recent Labs     07/31/13  0420  08/01/13  0143  08/02/13  0530  WBC  14.0*  11.8*  11.3*  HGB  11.0*  11.2*  9.5*  HCT  29.2*  29.3*  25.7*  PLT  189  182  175   BMET Recent Labs     08/01/13  0143  08/01/13  1600  08/02/13  0530  NA  143  142  144  K  2.1*  2.9*   2.3*  CL  104  107  110  CO2  26  21  24   BUN  19  16  14   CREATININE  1.74*  1.53*  1.52*  GLUCOSE  111*  121*  143*   Electrolytes Recent Labs     07/31/13  0420  07/31/13  1516  08/01/13  0143  08/01/13  1600  08/02/13  0530  CALCIUM  7.2*  7.1*  6.8*  6.7*  6.5*  MG  2.7*  2.5  2.4   --    --   PHOS  1.8*  4.3  3.5   --    --    Liver Enzymes Recent Labs     07/30/13  1315  AST  15  ALT  6  ALKPHOS  77  BILITOT  1.0  ALBUMIN  2.2*   Glucose Recent Labs     07/30/13  1240  07/30/13  1451  GLUCAP  90  84    Imaging No results found.  ASSESSMENT / PLAN:  PULMONARY A: At Risk Aspiration P:   -  Monitor - Caution with feeding.  CARDIOVASCULAR A:  Bradycardia, likely secondary to profound hypokalemia Hypotension >> resolved Hypercholesterolemia P:  - Adjusted IV fluids accordingly. - Monitor heart rate while replacing electrolytes. - Started to wean off stress steroids 8/03.  RENAL A:   Hypokalemia, hypomagnesemia >> likely from GI losses Acute Kidney Injury - in setting of vomiting / dehydration P:   - Monitor renal fx, urine outpt - F/u and replace electrolytes as needed  GASTROINTESTINAL A:   Nausea / Vomiting >> improved Diarrhea >> improved Obesity P:   - Full liquid and advance as tolerated. - PRN zofran.  HEMATOLOGIC A:   Anemia  P:  - F/u CBC  INFECTIOUS A:   Gastroenteritis >> resolved E coli UTI P:   - D 4 cipro. - D/C flagyl on 8/3.  ENDOCRINE A:  Relative adrenal insufficiency P:   - Wean off stress steroids as ordered.  NEUROLOGIC A:   Richard Greene  P:   - Monitor/supportive care.  Transfer to tele, TRH to pick up on 8/5, PCCM will sign off, please call back if needed.  [name], M.D. [facility] Pulmonary/Critical Care Medicine. Pager: [phone]. After hours pager: [phone].

## 2013-08-02 NOTE — Progress Notes (Signed)
eLink Physician-Brief Progress Note Patient Name: Richard Greene DOB: 05-02-1956 MRN: [id number]  Date of Service  08/02/2013   HPI/Events of Note  Patient with agitation - pulling at IVs, mittens in place.  Nurse and mother requesting something to help patient rest.  QTc greater than 500.   eICU Interventions  Plan: 1 mg ativan IV times one now   Intervention Category Minor Interventions: Agitation / anxiety - evaluation and management  Lachelle Rissler 08/02/2013, 3:12 AM

## 2013-08-02 NOTE — Care Management Note (Addendum)
    Page 1 of 2   08/05/2013     10:17:51 AM   CARE MANAGEMENT NOTE 08/05/2013  Patient:  Richard Greene, Richard Greene   Account Number:  [id number]  Date Initiated:  08/02/2013  Documentation initiated by:  Junius Creamer  Subjective/Objective Assessment:   adm w renal failure     Action/Plan:   lives w mother, pcp dr Minerva Areola dean   Anticipated DC Date:  08/03/2013   Anticipated DC Plan:  HOME W HOME HEALTH SERVICES      DC Planning Services  CM consult      [facility] Choice  HOME HEALTH   Choice offered to / List presented to:  C-6 Parent        HH arranged  HH-1 RN  HH-2 PT  HH-4 NURSE'S AIDE  HH-6 SOCIAL WORKER      HH agency  [facility]   Status of service:  Completed, signed off Medicare Important Message given?   (If response is "NO", the following Medicare IM given date fields will be blank) Date Medicare IM given:   Date Additional Medicare IM given:    Discharge Disposition:  HOME W HOME HEALTH SERVICES  Per UR Regulation:  Reviewed for med. necessity/level of care/duration of stay  If discussed at Long Length of Stay Meetings, dates discussed:   08/05/2013    Comments:   08-05-13 [address], [place] [phone] CM was able ot contact [facility] and the Liaison for [facility] stated that pt is eliegible for services. [facility] will provide pt with resources in the community. He may be eligiblefor an adult day care center or an aide to come out and sit with pt during the day. Plan is for home once medically stable. No further needs from CM at this time.  08-04-13 [address] [name], RN,BSN [phone] CM did call [facility] in regards to [facility] assistance. Pt is active with [facility]. No further needs from CM at this time.  08/03/13- 1050- [name] RN, BSN [phone] Pt for d/c today, has orders for HH-RN, PT, aide, CSW- call made to Lupita Leash with [facility] regarding referral and d/c for today- HH services to begin within 24-48 hr post discharge.  8/4 1037a debbie dowell  rn,bsn spoke w pt's mom, mom and dad(dad is blind) care for pt. mother does not want snf. she has not decided what they who will care for pt if they get to where they cannot. mother would like help. enc her to speak w dr dean and get him to work on [employer] for pt. have spoke to [employer] and mother in agreement. she will send out forms to get pt on caps waiting list but list may be 2 yrs long. pcs will be quickest option to get aid in home to help mother poss 3hrs 5times per wk but md must initiate this and this is several weeks process also. mother agreeable to [facility] and no pref. ref to donna w [facility] for rn-pt-sw-bath aid.

## 2013-08-02 NOTE — Progress Notes (Signed)
eLink Physician-Brief Progress Note Patient Name: Richard Greene DOB: 1956-08-12 MRN: [id number]  Date of Service  08/02/2013   HPI/Events of Note  Hypokalemia   eICU Interventions  Potassium replaced in the setting of ongoing potassium replacement   Intervention Category Intermediate Interventions: Electrolyte abnormality - evaluation and management  DETERDING,ELIZABETH 08/02/2013, 6:33 AM

## 2013-08-02 NOTE — Evaluation (Signed)
Physical Therapy Evaluation Patient Details Name: Richard Greene MRN: [id number] DOB: November 03, 1956 Today's Date: 08/02/2013 Time: 8657-8469 PT Time Calculation (min): 28 min  PT Assessment / Plan / Recommendation History of Present Illness  Richard Greene is a 57 y.o. male with a past medical history of Down syndrome, who does not take any medications at home. Patient lives with his mother who has accompanied him today. Patient is unable to provide any history. Mother is a very poor historian as well. Apparently, patient has been having watery stools, up to 2-3 times a day for the last one month. She is unable to describe the stool but says it has been small quantity. He's also been vomiting sporadically. There has been no history of fever or chills. Patient's mother hasn't noticed any grimacing, signifying pain. She denies that the patient has been on antibiotics recently. She tells me that he has been urinating without any difficulty. Hasn't noticed any decrease in the amount urine. Due to the patient's advanced mental retardation history is extremely limited.  Clinical Impression  Pt found soiled in urine and loose stool. Pt required total A x2 to change depends due to pt with low level comprehension and strongly resisting. Per mother pt ambulate I'ly when feeling well. Pt to strongly benefit from [facility] aide to assist with ADLs and HHPT to achieve PLOF.    PT Assessment  Patient needs continued PT services    Follow Up Recommendations  Home health PT;Supervision/Assistance - 24 hour    Does the patient have the potential to tolerate intense rehabilitation      Barriers to Discharge  (mother/father elderly) pt to require [facility] services of aide and PT to assist with care for safe d/ chome    Equipment Recommendations  None recommended by PT    Recommendations for Other Services     Frequency Min 3X/week    Precautions / Restrictions Precautions Precautions: Fall Precaution Comments: pt with low  level comprehension due to pt with Downs Syndrome Restrictions Weight Bearing Restrictions: No   Pertinent Vitals/Pain Pt hypersensitive when performing pericare due to skin breakdown from freq loose stools      Mobility  Bed Mobility Bed Mobility: Supine to Sit Supine to Sit: 2: Max assist;HOB elevated Details for Bed Mobility Assistance: maxA due to pt with low level of comprehension requiring max verbal/tactile cues to complete task Transfers Transfers: Sit to Stand;Stand to Sit Sit to Stand: 1: +2 Total assist;With upper extremity assist;From bed Sit to Stand: Patient Percentage: 60% Stand to Sit: 1: +2 Total assist;With upper extremity assist;To chair/3-in-1 Stand to Sit: Patient Percentage: 60% Details for Transfer Assistance: pt requires max tactile directional cues to complete task Ambulation/Gait Ambulation/Gait Assistance: 1: +2 Total assist Ambulation/Gait: Patient Percentage: 60% Ambulation Distance (Feet): 12 Feet Assistive device: 2 person hand held assist Ambulation/Gait Assistance Details: increased bilat UE WBing, slow sequencing, narrow base of support, crossover Gait Pattern: Step-through pattern;Decreased stride length;Narrow base of support Gait velocity: slow Stairs: No    Exercises     PT Diagnosis: Generalized weakness;Difficulty walking  PT Problem List: Decreased strength;Decreased activity tolerance;Decreased balance;Decreased mobility PT Treatment Interventions: Functional mobility training;Gait training;Therapeutic activities     PT Goals(Current goals can be found in the care plan section) Acute Rehab PT Goals Patient Stated Goal: unable to state PT Goal Formulation: With family Time For Goal Achievement: 08/16/13 Potential to Achieve Goals: Fair  Visit Information  Last PT Received On: 08/02/13 Assistance Needed: +2 History of Present  Illness: Richard Greene is a 57 y.o. male with a past medical history of Down syndrome, who does not take  any medications at home. Patient lives with his mother who has accompanied him today. Patient is unable to provide any history. Mother is a very poor historian as well. Apparently, patient has been having watery stools, up to 2-3 times a day for the last one month. She is unable to describe the stool but says it has been small quantity. He's also been vomiting sporadically. There has been no history of fever or chills. Patient's mother hasn't noticed any grimacing, signifying pain. She denies that the patient has been on antibiotics recently. She tells me that he has been urinating without any difficulty. Hasn't noticed any decrease in the amount urine. Due to the patient's advanced mental retardation history is extremely limited.       Prior Functioning  Home Living Family/patient expects to be discharged to:: Private residence Living Arrangements:  (mother) Available Help at Discharge: Family;Available 24 hours/day (mother/father however they are elderly) Type of Home: House Home Access: Level entry Home Layout: One level Home Equipment: None Prior Function Level of Independence: Needs assistance Gait / Transfers Assistance Needed: pt amb without AD ADL's / Homemaking Assistance Needed: assist for dressing/bathing/eating/cooking Communication / Swallowing Assistance Needed: pt non-verbal due to downs syndrome Communication Communication: Expressive difficulties (pt non verbal)    Cognition  Cognition Arousal/Alertness: Awake/alert Behavior During Therapy: WFL for tasks assessed/performed Overall Cognitive Status: History of cognitive impairments - at baseline    Extremity/Trunk Assessment Upper Extremity Assessment Upper Extremity Assessment: Overall WFL for tasks assessed Lower Extremity Assessment Lower Extremity Assessment: Overall WFL for tasks assessed Cervical / Trunk Assessment Cervical / Trunk Assessment: Normal   Balance    End of Session PT - End of Session Equipment  Utilized During Treatment: Gait belt Activity Tolerance: Patient tolerated treatment well Patient left: in chair;with call bell/phone within reach;with nursing/sitter in room Nurse Communication: Mobility status (pt soiled in urine and loose stool)  GP     Kimoni Pagliarulo Marie 08/02/2013, 3:21 PM  Lewis Shock, PT, DPT Pager #: [phone] Office #: [phone]

## 2013-08-02 NOTE — Progress Notes (Signed)
CRITICAL VALUE ALERT  Critical value received:  K=2.3  Date of notification:  08/02/2013  Time of notification:  0627  Critical value read back:yes  Nurse who received alert:  G. Mayford Knife RN  MD notified (1st page):  Dr Darrick Penna Pola Corn nurse informed)  Time of first page:  0627  MD notified (2nd page):  Time of second page:  Responding MD:  Dr Darrick Penna  Time MD responded:  0630

## 2013-08-03 ENCOUNTER — Encounter (HOSPITAL_COMMUNITY): Payer: Self-pay | Admitting: General Practice

## 2013-08-03 DIAGNOSIS — E2740 Unspecified adrenocortical insufficiency: Secondary | ICD-10-CM | POA: Diagnosis present

## 2013-08-03 DIAGNOSIS — R197 Diarrhea, unspecified: Secondary | ICD-10-CM | POA: Diagnosis present

## 2013-08-03 DIAGNOSIS — Q909 Down syndrome, unspecified: Secondary | ICD-10-CM

## 2013-08-03 DIAGNOSIS — N179 Acute kidney failure, unspecified: Secondary | ICD-10-CM | POA: Diagnosis present

## 2013-08-03 DIAGNOSIS — K599 Functional intestinal disorder, unspecified: Secondary | ICD-10-CM

## 2013-08-03 DIAGNOSIS — E876 Hypokalemia: Secondary | ICD-10-CM | POA: Diagnosis present

## 2013-08-03 DIAGNOSIS — R112 Nausea with vomiting, unspecified: Secondary | ICD-10-CM | POA: Diagnosis present

## 2013-08-03 DIAGNOSIS — E2749 Other adrenocortical insufficiency: Secondary | ICD-10-CM

## 2013-08-03 DIAGNOSIS — N390 Urinary tract infection, site not specified: Secondary | ICD-10-CM | POA: Diagnosis present

## 2013-08-03 DIAGNOSIS — K529 Noninfective gastroenteritis and colitis, unspecified: Secondary | ICD-10-CM | POA: Diagnosis present

## 2013-08-03 LAB — CBC
HCT: 30.2 % — ABNORMAL LOW (ref 39.0–52.0)
MCH: 34.2 pg — ABNORMAL HIGH (ref 26.0–34.0)
MCV: 93.8 fL (ref 78.0–100.0)
Platelets: 211 10*3/uL (ref 150–400)
RDW: 18.1 % — ABNORMAL HIGH (ref 11.5–15.5)

## 2013-08-03 LAB — BASIC METABOLIC PANEL
BUN: 10 mg/dL (ref 6–23)
CO2: 22 mEq/L (ref 19–32)
Calcium: 7.3 mg/dL — ABNORMAL LOW (ref 8.4–10.5)
Chloride: 109 mEq/L (ref 96–112)
Creatinine, Ser: 1.32 mg/dL (ref 0.50–1.35)
Glucose, Bld: 104 mg/dL — ABNORMAL HIGH (ref 70–99)

## 2013-08-03 LAB — CLOSTRIDIUM DIFFICILE BY PCR: Toxigenic C. Difficile by PCR: NEGATIVE

## 2013-08-03 MED ORDER — PANTOPRAZOLE SODIUM 40 MG PO TBEC: 40.0000 mg | DELAYED_RELEASE_TABLET | Freq: Every day | ORAL | Status: DC

## 2013-08-03 MED ORDER — PROMETHAZINE HCL 12.5 MG PO TABS: 12.5000 mg | ORAL_TABLET | Freq: Four times a day (QID) | ORAL | Status: DC | PRN

## 2013-08-03 MED ORDER — PREDNISONE 50 MG PO TABS
60.0000 mg | ORAL_TABLET | Freq: Every day | ORAL | Status: DC
  Filled 2013-08-03: qty 1

## 2013-08-03 MED ORDER — HYDROCORTISONE SOD SUCCINATE 100 MG IJ SOLR
25.0000 mg | Freq: Two times a day (BID) | INTRAMUSCULAR | Status: DC
  Filled 2013-08-03 (×2): qty 0.5

## 2013-08-03 MED ORDER — CIPROFLOXACIN HCL 500 MG PO TABS
500.0000 mg | ORAL_TABLET | Freq: Two times a day (BID) | ORAL | Status: DC
  Administered 2013-08-03 – 2013-08-04 (×3): 500 mg via ORAL
  Filled 2013-08-03 (×5): qty 1

## 2013-08-03 MED ORDER — PREDNISONE 10 MG PO TABS
60.0000 mg | ORAL_TABLET | Freq: Once | ORAL | Status: DC
  Filled 2013-08-03: qty 1

## 2013-08-03 MED ORDER — K PHOS MONO-SOD PHOS DI & MONO 155-852-130 MG PO TABS
500.0000 mg | ORAL_TABLET | Freq: Two times a day (BID) | ORAL | Status: AC
  Administered 2013-08-03 – 2013-08-04 (×2): 500 mg via ORAL
  Filled 2013-08-03 (×2): qty 2

## 2013-08-03 MED ORDER — PREDNISONE 20 MG PO TABS
40.0000 mg | ORAL_TABLET | Freq: Every day | ORAL | Status: DC
  Administered 2013-08-04: 40 mg via ORAL
  Filled 2013-08-03 (×2): qty 2

## 2013-08-03 MED ORDER — PREDNISONE 50 MG PO TABS
60.0000 mg | ORAL_TABLET | Freq: Once | ORAL | Status: AC
  Administered 2013-08-03: 60 mg via ORAL
  Filled 2013-08-03: qty 1

## 2013-08-03 MED ORDER — CIPROFLOXACIN HCL 500 MG PO TABS: 500.0000 mg | ORAL_TABLET | Freq: Two times a day (BID) | ORAL | Status: DC

## 2013-08-03 MED ORDER — PANTOPRAZOLE SODIUM 40 MG PO TBEC
40.0000 mg | DELAYED_RELEASE_TABLET | Freq: Every day | ORAL | Status: DC
  Administered 2013-08-03 – 2013-08-04 (×2): 40 mg via ORAL
  Filled 2013-08-03 (×2): qty 1

## 2013-08-03 MED ORDER — PREDNISONE (PAK) 10 MG PO TABS: ORAL_TABLET | ORAL | Status: DC

## 2013-08-03 NOTE — Progress Notes (Signed)
Shift event: RN paged NP secondary to pt falling while up to bathroom assisted by mother. Pt has Down Syndrome and is cared for at home by mother. Pt fell on right side without apparent injury. No head injury. NP to bedside. Pt is sitting up in chair. MOE x 4. Is agitated when I attempt to examine him and he pushes me away. He was able to stand and ambulate after the fall. Will cont to monitor.  Jimmye Norman, NP [facility]

## 2013-08-03 NOTE — Progress Notes (Addendum)
Patient ID: Richard Greene  male  ZOX:[id number]    DOB: [birth year]    DOA: 07/30/2013  PCP: Willey Blade, MD  Brief interim summary  Patient is a 57 year old male with history of Down syndrome, lives with mother, presented on 07/30/13 with nausea, vomiting, diarrhea, bradycardia, profound hypokalemia, K <2.0, acute renal insufficiency creatinine 2.1. Patient was admitted to ICU for stabilization and was cared for by critical care team. TRH assumed care on 08/03/13.  Patient was found to have pansensitive Escherichia coli UTI, stool cultures are negative so far. He was initially started on ciprofloxacin and Flagyl. Flagyl was discontinued on 8/3. 2-D echo showed EF of 55-60%, likely bradycardia due to profound hypokalemia. He is still having diarrhea today, will check C. difficile PCR, wean off stress dose steroids today, start prednisone in a.m.   Assessment/Plan: Principal Problem:   Hypokalemia - Resolved today - Given patient is still having diarrhea, one watery bowel movement this morning, we'll recheck BMET in a.m. no potassium replacement today.  Active Problems:   Acute renal failure: Improved creatinine 1.3 today     Nausea with vomiting,   Diarrhea - Nausea and vomiting has been improved, tolerating dysphagia 3 diet - Check C. difficile due to persistent diarrhea and leukocytosis    Down syndrome- close to baseline per his mother    Adrenal insufficiency: - Wean off IV steroids today, start prednisone 60 mg,  can DC home with taper.    UTI (urinary tract infection) - Change to oral ciprofloxacin, will assess that he is able to tolerated  DVT Prophylaxis:  Code Status:  Disposition:Discussed with patient's mother at the bedside in detail, DC home in a.m. if remains medically stable, follow potassium level    Subjective: Mental status at baseline, one watery bowel movement today  Objective: Weight change:   Intake/Output Summary (Last 24 hours) at 08/03/13 1051 Last data  filed at 08/03/13 0800  Gross per 24 hour  Intake    360 ml  Output      0 ml  Net    360 ml   Blood pressure 96/63, pulse 60, temperature 97.2 F (36.2 C), temperature source Axillary, resp. rate 16, height 5' (1.524 m), weight 54.6 kg (120 lb 5.9 oz), SpO2 98.00%.  Physical Exam: General: Alert and awake, not in any acute distress. CVS: S1-S2 clear, no murmur rubs or gallops Chest: clear to auscultation bilaterally, no wheezing, rales or rhonchi Abdomen: soft nontender, nondistended, normal bowel sounds  Extremities: no cyanosis, clubbing or edema noted bilaterally Neuro: doesnot follow comamnds  Lab Results: Basic Metabolic Panel:  Recent Labs Lab 08/02/13 0530 08/03/13 0600  NA 144 142  K 2.3* 4.4  CL 110 109  CO2 24 22  GLUCOSE 143* 104*  BUN 14 10  CREATININE 1.52* 1.32  CALCIUM 6.5* 7.3*  MG  --  1.9  PHOS  --  1.9*   Liver Function Tests:  Recent Labs Lab 07/30/13 1315  AST 15  ALT 6  ALKPHOS 77  BILITOT 1.0  PROT 6.1  ALBUMIN 2.2*    Recent Labs Lab 07/30/13 1315  LIPASE 16   No results found for this basename: AMMONIA,  in the last 168 hours CBC:  Recent Labs Lab 07/30/13 1315  08/02/13 0530 08/03/13 0600  WBC 15.7*  < > 11.3* 13.1*  NEUTROABS 14.1*  --   --   --   HGB 10.1*  < > 9.5* 11.0*  HCT 26.9*  < > 25.7* 30.2*  MCV 90.6  < > 91.1 93.8  PLT 209  < > 175 211  < > = values in this interval not displayed. Cardiac Enzymes: No results found for this basename: CKTOTAL, CKMB, CKMBINDEX, TROPONINI,  in the last 168 hours BNP: No components found with this basename: POCBNP,  CBG:  Recent Labs Lab 07/30/13 1240 07/30/13 1451  GLUCAP 90 84     Micro Results: Recent Results (from the past 240 hour(s))  URINE CULTURE     Status: None   Collection Time    07/30/13  1:52 PM      Result Value Range Status   Specimen Description URINE, CLEAN CATCH   Final   Special Requests NONE   Final   Culture  Setup Time 07/30/2013 22:49    Final   Colony Count >=100,000 COLONIES/ML   Final   Culture ESCHERICHIA COLI   Final   Report Status 08/01/2013 FINAL   Final   Organism ID, Bacteria ESCHERICHIA COLI   Final  MRSA PCR SCREENING     Status: None   Collection Time    07/30/13  5:23 PM      Result Value Range Status   MRSA by PCR NEGATIVE  NEGATIVE Final   Comment:            The GeneXpert MRSA Assay (FDA     approved for NASAL specimens     only), is one component of a     comprehensive MRSA colonization     surveillance program. It is not     intended to diagnose MRSA     infection nor to guide or     monitor treatment for     MRSA infections.  STOOL CULTURE     Status: None   Collection Time    08/01/13  8:59 PM      Result Value Range Status   Specimen Description STOOL   Final   Special Requests NONE   Final   Culture     Final   Value: Culture reincubated for better growth     Performed at [facility]   Report Status PENDING   Incomplete    Studies/Results: Ct Head Wo Contrast  07/30/2013   *RADIOLOGY REPORT*  Clinical Data:  Fall  CT HEAD WITHOUT CONTRAST CT CERVICAL SPINE WITHOUT CONTRAST  Technique:  Multidetector CT imaging of the head and cervical spine was performed following the standard protocol without intravenous contrast.  Multiplanar CT image reconstructions of the cervical spine were also generated.  Comparison:   None  CT HEAD  Findings: Moderate atrophy.  Mild chronic microvascular ischemic change in the white matter.  No acute infarct, hemorrhage, or mass lesion.  Negative for skull fracture.  IMPRESSION: Atrophy and chronic microvascular ischemia.  No acute intracranial abnormality.  CT CERVICAL SPINE  Findings: There is transverse ligament laxity of C1 with widening of the atlanto dens interval and mild basilar invagination.  Advanced disc degeneration with disc space narrowing and spurring C3-C7.  Bilateral facet degeneration C3-4 and C4-5 and C5-6.  Negative for fracture.   IMPRESSION: Basilar vegetation with laxity of the  transverse ligament of C1.  Multilevel disc and facet degeneration.  Negative for fracture.   Original Report Authenticated By: Janeece Riggers, M.D.   Ct Cervical Spine Wo Contrast  07/30/2013   *RADIOLOGY REPORT*  Clinical Data:  Fall  CT HEAD WITHOUT CONTRAST CT CERVICAL SPINE WITHOUT CONTRAST  Technique:  Multidetector CT imaging of the head  and cervical spine was performed following the standard protocol without intravenous contrast.  Multiplanar CT image reconstructions of the cervical spine were also generated.  Comparison:   None  CT HEAD  Findings: Moderate atrophy.  Mild chronic microvascular ischemic change in the white matter.  No acute infarct, hemorrhage, or mass lesion.  Negative for skull fracture.  IMPRESSION: Atrophy and chronic microvascular ischemia.  No acute intracranial abnormality.  CT CERVICAL SPINE  Findings: There is transverse ligament laxity of C1 with widening of the atlanto dens interval and mild basilar invagination.  Advanced disc degeneration with disc space narrowing and spurring C3-C7.  Bilateral facet degeneration C3-4 and C4-5 and C5-6.  Negative for fracture.  IMPRESSION: Basilar vegetation with laxity of the  transverse ligament of C1.  Multilevel disc and facet degeneration.  Negative for fracture.   Original Report Authenticated By: Janeece Riggers, M.D.   Dg Chest Port 1 View  07/30/2013   *RADIOLOGY REPORT*  Clinical Data: Vomiting  PORTABLE CHEST - 1 VIEW  Comparison: None.  Findings: The heart and pulmonary vascularity are within normal limits.  The lungs are well-aerated bilaterally.  Mild right basilar infiltrate is noted.  This could be related to aspiration pneumonia.  No bony abnormality is seen.  IMPRESSION: Mild right basilar infiltrate.   Original Report Authenticated By: Alcide Clever, M.D.   Dg Abd Portable 1v  07/30/2013   *RADIOLOGY REPORT*  Clinical Data: Nausea and vomiting.  PORTABLE ABDOMEN - 1 VIEW   Comparison: 11/22/2018  Findings: There is slight gaseous distention of the stomach.  There is only a minimal amount of air scattered throughout the nondistended large and small bowel.  There appears to be a faint infiltrate at the right lung base there is also vague density at the left lung base which may represent a small infiltrate.  There is a dense 6 cm irregular calcification in the pelvis of unknown origin. There is a similar density seen in the pelvis with prior radiograph of 11/22/2008.  This could be associated with the bladder.  CT scan may better define the etiology of this abnormality.  IMPRESSION:  1.  Slight gaseous distention of the stomach. 2.  Faint bibasilar pulmonary infiltrates. 3. Large irregular calcification in the pelvis.  This might represent an unusual large bladder stone.   Original Report Authenticated By: Francene Boyers, M.D.    Medications: Scheduled Meds: . ciprofloxacin  500 mg Oral BID  . heparin  5,000 Units Subcutaneous Q8H  . hydrocortisone sod succinate (SOLU-CORTEF) inj  25 mg Intravenous Q12H  . pantoprazole  40 mg Oral Q0600  . [START ON 08/04/2013] predniSONE  60 mg Oral Q breakfast      LOS: 4 days   Khylan Sawyer M.D. [facility] 08/03/2013, 10:51 AM Pager: [phone]  If 7PM-7AM, please contact night-coverage [email or web address] Password [email or web address]

## 2013-08-04 ENCOUNTER — Inpatient Hospital Stay (HOSPITAL_COMMUNITY): Payer: Medicare Other

## 2013-08-04 ENCOUNTER — Encounter (HOSPITAL_COMMUNITY): Payer: Self-pay | Admitting: Radiology

## 2013-08-04 DIAGNOSIS — N390 Urinary tract infection, site not specified: Secondary | ICD-10-CM

## 2013-08-04 DIAGNOSIS — Q909 Down syndrome, unspecified: Secondary | ICD-10-CM

## 2013-08-04 LAB — BASIC METABOLIC PANEL
BUN: 10 mg/dL (ref 6–23)
BUN: 8 mg/dL (ref 6–23)
CO2: 26 mEq/L (ref 19–32)
CO2: 21 mEq/L (ref 19–32)
Chloride: 108 mEq/L (ref 96–112)
Chloride: 104 mEq/L (ref 96–112)
Creatinine, Ser: 1.35 mg/dL (ref 0.50–1.35)
GFR calc non Af Amer: 63 mL/min — ABNORMAL LOW (ref >90)
Glucose, Bld: 88 mg/dL (ref 70–99)
Glucose, Bld: 111 mg/dL — ABNORMAL HIGH (ref 70–99)
Potassium: 3 mEq/L — ABNORMAL LOW (ref 3.5–5.1)

## 2013-08-04 MED ORDER — LOPERAMIDE HCL 2 MG PO CAPS
4.0000 mg | ORAL_CAPSULE | Freq: Once | ORAL | Status: AC
Start: 2013-08-04 — End: 2013-08-04
  Administered 2013-08-04: 4 mg via ORAL
  Filled 2013-08-04: qty 2

## 2013-08-04 MED ORDER — HYDROCORTISONE 20 MG PO TABS
20.0000 mg | ORAL_TABLET | Freq: Two times a day (BID) | ORAL | Status: DC
  Administered 2013-08-04: 20 mg via ORAL
  Filled 2013-08-04 (×3): qty 1

## 2013-08-04 MED ORDER — POTASSIUM CHLORIDE CRYS ER 20 MEQ PO TBCR
40.0000 meq | EXTENDED_RELEASE_TABLET | Freq: Four times a day (QID) | ORAL | Status: DC
  Administered 2013-08-04 – 2013-08-05 (×7): 40 meq via ORAL
  Filled 2013-08-04 (×10): qty 2

## 2013-08-04 MED ORDER — LORAZEPAM 2 MG/ML IJ SOLN
0.5000 mg | Freq: Four times a day (QID) | INTRAMUSCULAR | Status: DC | PRN
  Administered 2013-08-04: 0.5 mg via INTRAMUSCULAR
  Filled 2013-08-04 (×2): qty 1

## 2013-08-04 NOTE — Progress Notes (Signed)
Physical Therapy Treatment Patient Details Name: Richard Greene MRN: [id number] DOB: 07-30-1956 Today's Date: 08/04/2013 Time: 2130-8657 PT Time Calculation (min): 24 min  PT Assessment / Plan / Recommendation  History of Present Illness 57 y/o male with h/o Down's Syndrome admitted 07/30/13 with one month h/o watery stools and intermittent vomiting.   PT Comments   Patient progressing this session with both transfers and ambulation, but difficult to push due to pt's level of cognition/dementia and refusing further activity after ambulation and hygiene.  Agree with d/c to home environment due to likely to participate better and rehab better with familiar setting.   Follow Up Recommendations  Home health PT;Supervision/Assistance - 24 hour     Does the patient have the potential to tolerate intense rehabilitation   N/A  Barriers to Discharge  Decreased caregiver support (has 24 hour care, but mother aged and also cares for her spouse.)      Equipment Recommendations  None recommended by PT    Recommendations for Other Services  (HH aide)  Frequency Min 3X/week   Progress towards PT Goals Progress towards PT goals: Progressing toward goals  Plan Current plan remains appropriate    Precautions / Restrictions Precautions Precautions: Fall   Pertinent Vitals/Pain No specific pain complaints, but seems sore due to frequent stools    Mobility  Bed Mobility Bed Mobility: Not assessed Details for Bed Mobility Assistance: pt in recliner, mother at bedside Transfers Sit to Stand: 4: Min assist;3: Mod assist;With upper extremity assist;From chair/3-in-1 Stand to Sit: 4: Min assist;To chair/3-in-1;With upper extremity assist Details for Transfer Assistance: initially increased assist due to increased time to complete task; with subsequent trials able to decrease assist, still increased time to stand with heavy UE assist Ambulation/Gait Ambulation/Gait Assistance: 3: Mod assist;4: Min  assist Ambulation Distance (Feet): 22 Feet Assistive device: Rolling walker Ambulation/Gait Assistance Details: walks as if saddle sore, assist to steer walker at times, obvious LE weakness with increased UE assist on walker Gait Pattern: Wide base of support;Left flexed knee in stance;Right flexed knee in stance;Trunk flexed;Decreased stride length    Exercises Other Exercises Other Exercises: Attempted to engage in LE strengthening exercises after unable to get pt to perform second walk, but pt refused withdrawing and pushing PT away     PT Goals (current goals can now be found in the care plan section)    Visit Information  Last PT Received On: 08/04/13 History of Present Illness: 57 y/o male with h/o Down's Syndrome admitted 07/30/13 with one month h/o watery stools and intermittent vomiting.    Subjective Data    Mother reports fell yesterday and feel 3 x day of admission with hitting back of his head when falling onto toilet.   Cognition  Cognition Arousal/Alertness: Awake/alert Behavior During Therapy: Flat affect Overall Cognitive Status: History of cognitive impairments - at baseline    Balance  Balance Balance Assessed: Yes Dynamic Standing Balance Dynamic Standing - Balance Support: Left upper extremity supported;During functional activity Dynamic Standing - Level of Assistance: 4: Min assist Dynamic Standing - Balance Activities: Reaching for objects Dynamic Standing - Comments: reaching down for donning briefs in standing; stood for hygiene due to liquid stool incontinence and soiled underwear x about 2 minutes  End of Session PT - End of Session Equipment Utilized During Treatment: Gait belt Activity Tolerance: Patient limited by fatigue Patient left: in chair;with family/visitor present Nurse Communication: Mobility status;Other (comment) (donned brief for ambulation)   GP  WYNN,CYNDI 08/04/2013, 11:37 AM Sheran Lawless, PT [phone] 08/04/2013

## 2013-08-04 NOTE — Progress Notes (Signed)
Chart reviewed.  Patient ID: Richard Greene  male  NFA:[id number]    DOB: 1956-11-17    DOA: 07/30/2013  PCP: Willey Blade, MD  Brief interim summary  Patient is a 57 year old male with history of Down syndrome, lives with mother, presented on 07/30/13 with nausea, vomiting, diarrhea, bradycardia, profound hypokalemia, K <2.0, acute renal insufficiency creatinine 2.1. Patient was admitted to ICU for stabilization and was cared for by critical care team. [facility] assumed care on 08/03/13.  Patient was found to have pansensitive Escherichia coli UTI, stool cultures are negative so far. He was initially started on ciprofloxacin and Flagyl. Flagyl was discontinued on 8/3. 2-D echo showed EF of 55-60%, likely bradycardia due to profound hypokalemia. He is still having diarrhea today, will check C. difficile PCR, wean off stress dose steroids today, start prednisone in a.m.   Assessment/Plan: Principal Problem:   Hypokalemia Still low. replete   Active Problems:   Acute renal failure: resolved     Nausea with vomiting,   Diarrhea - Nausea and vomiting has resolved. c diff negative. Give a dose of imodium    Down syndrome- close to baseline per his mother    Adrenal insufficiency: Change to cortef for better mineralocorticoid    UTI (urinary tract infection) Treated. D/c cipro, could be worsening diarrhea  DVT Prophylaxis:  Code Status:  Disposition: not stable for discharge  Subjective: Per mother and RN, still with slightly loose stool, though becoming more formed  Objective: Weight change:  No intake or output data in the 24 hours ending 08/04/13 1236 Blood pressure 101/58, pulse 70, temperature 98.2 F (36.8 C), temperature source Axillary, resp. rate 17, height 5' (1.524 m), weight 54.6 kg (120 lb 5.9 oz), SpO2 96.00%.  Physical Exam: General: Aeating lunch voraciously CVS: S1-S2 clear, no murmur rubs or gallops Chest: clear to auscultation bilaterally, no wheezing, rales or  rhonchi Abdomen: soft nontender, nondistended, normal bowel sounds  Extremities: no cyanosis, clubbing or edema noted bilaterally Neuro: doesnot follow comamnds  Lab Results: Basic Metabolic Panel:  Recent Labs Lab 08/03/13 0600 08/04/13 0620  NA 142 142  K 4.4 3.0*  CL 109 108  CO2 22 26  GLUCOSE 104* 88  BUN 10 10  CREATININE 1.32 1.26  CALCIUM 7.3* 7.1*  MG 1.9  --   PHOS 1.9*  --    Liver Function Tests:  Recent Labs Lab 07/30/13 1315  AST 15  ALT 6  ALKPHOS 77  BILITOT 1.0  PROT 6.1  ALBUMIN 2.2*    Recent Labs Lab 07/30/13 1315  LIPASE 16   No results found for this basename: AMMONIA,  in the last 168 hours CBC:  Recent Labs Lab 07/30/13 1315  08/02/13 0530 08/03/13 0600  WBC 15.7*  < > 11.3* 13.1*  NEUTROABS 14.1*  --   --   --   HGB 10.1*  < > 9.5* 11.0*  HCT 26.9*  < > 25.7* 30.2*  MCV 90.6  < > 91.1 93.8  PLT 209  < > 175 211  < > = values in this interval not displayed. Cardiac Enzymes: No results found for this basename: CKTOTAL, CKMB, CKMBINDEX, TROPONINI,  in the last 168 hours BNP: No components found with this basename: POCBNP,  CBG:  Recent Labs Lab 07/30/13 1240 07/30/13 1451  GLUCAP 90 84     Micro Results: Recent Results (from the past 240 hour(s))  URINE CULTURE     Status: None   Collection Time  07/30/13  1:52 PM      Result Value Range Status   Specimen Description URINE, CLEAN CATCH   Final   Special Requests NONE   Final   Culture  Setup Time 07/30/2013 22:49   Final   Colony Count >=100,000 COLONIES/ML   Final   Culture ESCHERICHIA COLI   Final   Report Status 08/01/2013 FINAL   Final   Organism ID, Bacteria ESCHERICHIA COLI   Final  MRSA PCR SCREENING     Status: None   Collection Time    07/30/13  5:23 PM      Result Value Range Status   MRSA by PCR NEGATIVE  NEGATIVE Final   Comment:            The GeneXpert MRSA Assay (FDA     approved for NASAL specimens     only), is one component of a      comprehensive MRSA colonization     surveillance program. It is not     intended to diagnose MRSA     infection nor to guide or     monitor treatment for     MRSA infections.  STOOL CULTURE     Status: None   Collection Time    08/01/13  8:59 PM      Result Value Range Status   Specimen Description STOOL   Final   Special Requests NONE   Final   Culture     Final   Value: MODERATE YEAST     Note: REDUCED NORMAL FLORA PRESENT     Performed at [facility]   Report Status PENDING   Incomplete  CLOSTRIDIUM DIFFICILE BY PCR     Status: None   Collection Time    08/03/13 11:25 AM      Result Value Range Status   C difficile by pcr NEGATIVE  NEGATIVE Final    Studies/Results: Ct Head Wo Contrast  07/30/2013   *RADIOLOGY REPORT*  Clinical Data:  Fall  CT HEAD WITHOUT CONTRAST CT CERVICAL SPINE WITHOUT CONTRAST  Technique:  Multidetector CT imaging of the head and cervical spine was performed following the standard protocol without intravenous contrast.  Multiplanar CT image reconstructions of the cervical spine were also generated.  Comparison:   None  CT HEAD  Findings: Moderate atrophy.  Mild chronic microvascular ischemic change in the white matter.  No acute infarct, hemorrhage, or mass lesion.  Negative for skull fracture.  IMPRESSION: Atrophy and chronic microvascular ischemia.  No acute intracranial abnormality.  CT CERVICAL SPINE  Findings: There is transverse ligament laxity of C1 with widening of the atlanto dens interval and mild basilar invagination.  Advanced disc degeneration with disc space narrowing and spurring C3-C7.  Bilateral facet degeneration C3-4 and C4-5 and C5-6.  Negative for fracture.  IMPRESSION: Basilar vegetation with laxity of the  transverse ligament of C1.  Multilevel disc and facet degeneration.  Negative for fracture.   Original Report Authenticated By: Janeece Riggers, M.D.   Ct Cervical Spine Wo Contrast  07/30/2013   *RADIOLOGY REPORT*  Clinical Data:   Fall  CT HEAD WITHOUT CONTRAST CT CERVICAL SPINE WITHOUT CONTRAST  Technique:  Multidetector CT imaging of the head and cervical spine was performed following the standard protocol without intravenous contrast.  Multiplanar CT image reconstructions of the cervical spine were also generated.  Comparison:   None  CT HEAD  Findings: Moderate atrophy.  Mild chronic microvascular ischemic change in the white matter.  No  acute infarct, hemorrhage, or mass lesion.  Negative for skull fracture.  IMPRESSION: Atrophy and chronic microvascular ischemia.  No acute intracranial abnormality.  CT CERVICAL SPINE  Findings: There is transverse ligament laxity of C1 with widening of the atlanto dens interval and mild basilar invagination.  Advanced disc degeneration with disc space narrowing and spurring C3-C7.  Bilateral facet degeneration C3-4 and C4-5 and C5-6.  Negative for fracture.  IMPRESSION: Basilar vegetation with laxity of the  transverse ligament of C1.  Multilevel disc and facet degeneration.  Negative for fracture.   Original Report Authenticated By: Janeece Riggers, M.D.   Dg Chest Port 1 View  07/30/2013   *RADIOLOGY REPORT*  Clinical Data: Vomiting  PORTABLE CHEST - 1 VIEW  Comparison: None.  Findings: The heart and pulmonary vascularity are within normal limits.  The lungs are well-aerated bilaterally.  Mild right basilar infiltrate is noted.  This could be related to aspiration pneumonia.  No bony abnormality is seen.  IMPRESSION: Mild right basilar infiltrate.   Original Report Authenticated By: Alcide Clever, M.D.   Dg Abd Portable 1v  07/30/2013   *RADIOLOGY REPORT*  Clinical Data: Nausea and vomiting.  PORTABLE ABDOMEN - 1 VIEW  Comparison: 11/22/2018  Findings: There is slight gaseous distention of the stomach.  There is only a minimal amount of air scattered throughout the nondistended large and small bowel.  There appears to be a faint infiltrate at the right lung base there is also vague density at the left  lung base which may represent a small infiltrate.  There is a dense 6 cm irregular calcification in the pelvis of unknown origin. There is a similar density seen in the pelvis with prior radiograph of 11/22/2008.  This could be associated with the bladder.  CT scan may better define the etiology of this abnormality.  IMPRESSION:  1.  Slight gaseous distention of the stomach. 2.  Faint bibasilar pulmonary infiltrates. 3. Large irregular calcification in the pelvis.  This might represent an unusual large bladder stone.   Original Report Authenticated By: Francene Boyers, M.D.    Medications: Scheduled Meds: . ciprofloxacin  500 mg Oral BID  . heparin  5,000 Units Subcutaneous Q8H  . pantoprazole  40 mg Oral Q0600  . predniSONE  40 mg Oral Q breakfast      LOS: 5 days   Christiane Ha M.D. [facility] 08/04/2013, 12:36 PM Pager: [phone]  If 7PM-7AM, please contact night-coverage [email or web address] Password [email or web address]

## 2013-08-04 NOTE — Progress Notes (Addendum)
Shift event: RN called this NP secondary to pt having seizure like activity. Witnessed by a staff member described as generalized shaking "convulsing". Sleepy after but now arousable. Was incontinent of urine. No Ativan given because pt already reactive by the time RN called this NP. Reviewed notes in chart and do not see a hx of seizures. Tried to call his mother without success. He is afebrile. Na was normal today but K low. Will r/p stat BMP. Pt fell going to the bathroom with his mother this week. No head injury noted at that time. However, given known hx of fall and now ? new onset of seizure, will check stat CT head. Ativan prior to CT-due to pt's mental retardation/Down, he will not be cooperative. I ordered an EEG in am and Ativan prn. If seizure recurs tonight, will call neuro and possibly start Keppra. If not, can get neuro consult in am as long as pt is stable.   Addendum/update: CT head neg for acute issues. BMP with normal sodium, K 3.2. Just started K supplementation today, will recheck K at noon 08/05/13.  Addendum/update: No further seizure type activity as of 0600.  Jimmye Norman, NP [facility]

## 2013-08-05 ENCOUNTER — Ambulatory Visit (HOSPITAL_COMMUNITY): Payer: Medicare Other

## 2013-08-05 DIAGNOSIS — R569 Unspecified convulsions: Secondary | ICD-10-CM

## 2013-08-05 LAB — BASIC METABOLIC PANEL
BUN: 9 mg/dL (ref 6–23)
CO2: 27 mEq/L (ref 19–32)
Calcium: 7 mg/dL — ABNORMAL LOW (ref 8.4–10.5)
Chloride: 109 mEq/L (ref 96–112)
Creatinine, Ser: 1.25 mg/dL (ref 0.50–1.35)
Glucose, Bld: 112 mg/dL — ABNORMAL HIGH (ref 70–99)

## 2013-08-05 LAB — MAGNESIUM: Magnesium: 1.7 mg/dL (ref 1.5–2.5)

## 2013-08-05 LAB — STOOL CULTURE

## 2013-08-05 MED ORDER — HYDROCORTISONE 20 MG PO TABS
20.0000 mg | ORAL_TABLET | Freq: Every day | ORAL | Status: DC
  Administered 2013-08-05 – 2013-08-06 (×2): 20 mg via ORAL
  Filled 2013-08-05 (×2): qty 1

## 2013-08-05 NOTE — Progress Notes (Signed)
Physical Therapy Treatment Patient Details Name: Richard Greene MRN: [id number] DOB: 1956-12-29 Today's Date: 08/05/2013 Time: 7829-5621 PT Time Calculation (min): 29 min  PT Assessment / Plan / Recommendation  History of Present Illness 57 y/o male with h/o Down's Syndrome admitted 07/30/13 with one month h/o watery stools and intermittent vomiting.   PT Comments   Patient making progress with distance with ambulation, but increased assist for bed mobility and transfers due to poor participation.  Spoke by phone with MD who discontinued PT order due to feeling pt likely at his functional baseline.  Did discuss with pt's mother today concern for her ability to care for pt.  She hopes to at least try with [facility] services.  Still hope he will participate better in his home environment.    Follow Up Recommendations  Home health PT;Supervision/Assistance - 24 hour           Equipment Recommendations  None recommended by PT       Frequency Other (Comment) (d/c PT due to MD d/c PT)   Progress towards PT Goals Progress towards PT goals: Progressing toward goals (D/C PT due to MD D/C PT)  Plan Current plan remains appropriate    Precautions / Restrictions Precautions Precautions: Fall   Pertinent Vitals/Pain Grimaces and combative with perineal hygiene    Mobility  Bed Mobility Bed Mobility: Rolling Left;Rolling Right Rolling Right: 1: +2 Total assist Rolling Right: Patient Percentage: 0% Rolling Left: 1: +2 Total assist Rolling Left: Patient Percentage: 0% Supine to Sit: 2: Max assist Details for Bed Mobility Assistance: for rolling pt resistive due to not wanting to be cleaned due to painful perineal area.  increased assist to sit up due to patient not as cooperative Transfers Sit to Stand: 3: Mod assist;From bed Stand to Sit: 4: Min guard;To chair/3-in-1 Details for Transfer Assistance: increased assist to stand today due to pt resistive; able to follow cues today to reach for armrests  to sit Ambulation/Gait Ambulation/Gait Assistance: 3: Mod assist Ambulation Distance (Feet): 45 Feet Assistive device: Rolling walker Ambulation/Gait Assistance Details: assist to maneuver walker and encourage continuing with gait.  Pt wanting to stop as soon as he saw the chair despite attempts at redirection Gait Pattern: Wide base of support;Trunk flexed    Exercises Other Exercises Other Exercises: Attempted to have pt pull or push chair with feet back to room with multiple cues and encouragement, participated < 25%    PT Goals (current goals can now be found in the care plan section)    Visit Information  Last PT Received On: 08/05/13 History of Present Illness: 57 y/o male with h/o Down's Syndrome admitted 07/30/13 with one month h/o watery stools and intermittent vomiting.    Subjective Data   Mother voices hopes to be able to continue to care for him at home.   Cognition  Cognition Arousal/Alertness: Awake/alert Behavior During Therapy: Agitated Overall Cognitive Status: History of cognitive impairments - at baseline    Balance   N/A  End of Session PT - End of Session Equipment Utilized During Treatment: Gait belt Activity Tolerance: Patient limited by fatigue Patient left: in chair;with family/visitor present   GP     [facility] 08/05/2013, 4:35 PM Sheran Lawless, PT [phone] 08/05/2013'

## 2013-08-05 NOTE — Progress Notes (Addendum)
While pt was being cleaned by 2 RNs and a NT from having a BM, pt eyes rolled back, head jerked backwards and arms were stiff while pt was shaking. Pt has down syndrome and is nonverbal at baseline. Pt normally aggressive when pt is being changed however pt was sluggish and not aggressive after event. Pt HR 140's during event. BP 133/66, o2 97% 2L [place]. Pt would open eyes to verbal stimuli.  MD on call Maren Reamer NP notified. New orders received will continue to monitor.

## 2013-08-05 NOTE — Progress Notes (Signed)
EEG attempted with 2 techs and nurse; Dr Lendell Caprice will cancel due to pt combative.

## 2013-08-05 NOTE — Plan of Care (Signed)
Problem: Phase III Progression Outcomes Goal: Voiding independently Outcome: Completed/Met Date Met:  08/05/13 Pt inc B&B

## 2013-08-05 NOTE — Progress Notes (Signed)
Events of last night noted.  Patient ID: Richard Greene  male  ZOX:[id number]    DOB: Sep 26, 1956    DOA: 07/30/2013  PCP: Willey Blade, MD  Brief interim summary  Patient is a 57 year old male with history of Down syndrome, lives with mother, presented on 07/30/13 with nausea, vomiting, diarrhea, bradycardia, profound hypokalemia, K <2.0, acute renal insufficiency creatinine 2.1. Patient was admitted to ICU for stabilization and was cared for by critical care team. [facility] assumed care on 08/03/13.  Patient was found to have pansensitive Escherichia coli UTI, stool cultures are negative so far. He was initially started on ciprofloxacin and Flagyl. Flagyl was discontinued on 8/3. 2-D echo showed EF of 55-60%, likely bradycardia due to profound hypokalemia. He is still having diarrhea today, will check C. difficile PCR, wean off stress dose steroids today, start prednisone in a.m.   Assessment/Plan:  New seizure: Per mother, no history of same. CAT scan negative. May have been provoked/precipitated by acute illness, UTI, electrolyte disturbances, diarrheal illness and recent course of ciprofloxacin which can lower seizure threshold. Await EEG. Seems back to baseline currently. Discussed with neurology. They would not start antiepileptics unless the EEG shows continued a lentiform activity or patient has another seizure. In this 57 year old Down syndrome patient, may also have been related to developing Alzheimer's. Not stable for discharge    Hypokalemia Still low. replete and recheck this afternoon and tomorrow morning.   Active Problems:   Acute renal failure: resolved     Nausea with vomiting,   Diarrhea - Nausea and vomiting has resolved. c diff negative. Per nursing staff, seems to be improving.    Down syndrome- close to baseline per his mother    Adrenal insufficiency: Wean cortef as blood pressure tolerated    UTI (urinary tract infection) Treated.   DVT Prophylaxis:  Code Status:    Disposition: not stable for discharge  Subjective: Per nurse tech, no problems this morning.  Objective: Weight change:   Intake/Output Summary (Last 24 hours) at 08/05/13 0920 Last data filed at 08/04/13 1807  Gross per 24 hour  Intake    360 ml  Output      0 ml  Net    360 ml   Blood pressure 104/58, pulse 62, temperature 98.6 F (37 C), temperature source Oral, resp. rate 17, height 5' (1.524 m), weight 54.6 kg (120 lb 5.9 oz), SpO2 100.00%.  Physical Exam: General: Eating breakfast CVS: S1-S2 clear, no murmur rubs or gallops Chest: clear to auscultation bilaterally, no wheezing, rales or rhonchi Abdomen: soft nontender, nondistended, normal bowel sounds  Extremities: no cyanosis, clubbing or edema noted bilaterally Neuro:  no obvious deficits. Unchanged from yesterday.  Lab Results: Basic Metabolic Panel:  Recent Labs Lab 08/03/13 0600 08/04/13 0620 08/04/13 2309  NA 142 142 142  K 4.4 3.0* 3.2*  CL 109 108 104  CO2 22 26 21   GLUCOSE 104* 88 111*  BUN 10 10 8   CREATININE 1.32 1.26 1.35  CALCIUM 7.3* 7.1* 7.4*  MG 1.9  --   --   PHOS 1.9*  --   --    Liver Function Tests:  Recent Labs Lab 07/30/13 1315  AST 15  ALT 6  ALKPHOS 77  BILITOT 1.0  PROT 6.1  ALBUMIN 2.2*    Recent Labs Lab 07/30/13 1315  LIPASE 16   No results found for this basename: AMMONIA,  in the last 168 hours CBC:  Recent Labs Lab 07/30/13 1315  08/02/13 0530  08/03/13 0600  WBC 15.7*  < > 11.3* 13.1*  NEUTROABS 14.1*  --   --   --   HGB 10.1*  < > 9.5* 11.0*  HCT 26.9*  < > 25.7* 30.2*  MCV 90.6  < > 91.1 93.8  PLT 209  < > 175 211  < > = values in this interval not displayed. Cardiac Enzymes: No results found for this basename: CKTOTAL, CKMB, CKMBINDEX, TROPONINI,  in the last 168 hours BNP: No components found with this basename: POCBNP,  CBG:  Recent Labs Lab 07/30/13 1240 07/30/13 1451  GLUCAP 90 84     Micro Results: Recent Results (from the  past 240 hour(s))  URINE CULTURE     Status: None   Collection Time    07/30/13  1:52 PM      Result Value Range Status   Specimen Description URINE, CLEAN CATCH   Final   Special Requests NONE   Final   Culture  Setup Time 07/30/2013 22:49   Final   Colony Count >=100,000 COLONIES/ML   Final   Culture ESCHERICHIA COLI   Final   Report Status 08/01/2013 FINAL   Final   Organism ID, Bacteria ESCHERICHIA COLI   Final  MRSA PCR SCREENING     Status: None   Collection Time    07/30/13  5:23 PM      Result Value Range Status   MRSA by PCR NEGATIVE  NEGATIVE Final   Comment:            The GeneXpert MRSA Assay (FDA     approved for NASAL specimens     only), is one component of a     comprehensive MRSA colonization     surveillance program. It is not     intended to diagnose MRSA     infection nor to guide or     monitor treatment for     MRSA infections.  STOOL CULTURE     Status: None   Collection Time    08/01/13  8:59 PM      Result Value Range Status   Specimen Description STOOL   Final   Special Requests NONE   Final   Culture     Final   Value: MODERATE YEAST     NO SALMONELLA, SHIGELLA, CAMPYLOBACTER, YERSINIA, OR E.COLI 0157:H7 ISOLATED     Note: REDUCED NORMAL FLORA PRESENT     Performed at [facility]   Report Status 08/05/2013 FINAL   Final  CLOSTRIDIUM DIFFICILE BY PCR     Status: None   Collection Time    08/03/13 11:25 AM      Result Value Range Status   C difficile by pcr NEGATIVE  NEGATIVE Final    Studies/Results: Ct Head Wo Contrast  07/30/2013   *RADIOLOGY REPORT*  Clinical Data:  Fall  CT HEAD WITHOUT CONTRAST CT CERVICAL SPINE WITHOUT CONTRAST  Technique:  Multidetector CT imaging of the head and cervical spine was performed following the standard protocol without intravenous contrast.  Multiplanar CT image reconstructions of the cervical spine were also generated.  Comparison:   None  CT HEAD  Findings: Moderate atrophy.  Mild chronic  microvascular ischemic change in the white matter.  No acute infarct, hemorrhage, or mass lesion.  Negative for skull fracture.  IMPRESSION: Atrophy and chronic microvascular ischemia.  No acute intracranial abnormality.  CT CERVICAL SPINE  Findings: There is transverse ligament laxity of C1 with widening of the atlanto  dens interval and mild basilar invagination.  Advanced disc degeneration with disc space narrowing and spurring C3-C7.  Bilateral facet degeneration C3-4 and C4-5 and C5-6.  Negative for fracture.  IMPRESSION: Basilar vegetation with laxity of the  transverse ligament of C1.  Multilevel disc and facet degeneration.  Negative for fracture.   Original Report Authenticated By: Janeece Riggers, M.D.   Ct Cervical Spine Wo Contrast  07/30/2013   *RADIOLOGY REPORT*  Clinical Data:  Fall  CT HEAD WITHOUT CONTRAST CT CERVICAL SPINE WITHOUT CONTRAST  Technique:  Multidetector CT imaging of the head and cervical spine was performed following the standard protocol without intravenous contrast.  Multiplanar CT image reconstructions of the cervical spine were also generated.  Comparison:   None  CT HEAD  Findings: Moderate atrophy.  Mild chronic microvascular ischemic change in the white matter.  No acute infarct, hemorrhage, or mass lesion.  Negative for skull fracture.  IMPRESSION: Atrophy and chronic microvascular ischemia.  No acute intracranial abnormality.  CT CERVICAL SPINE  Findings: There is transverse ligament laxity of C1 with widening of the atlanto dens interval and mild basilar invagination.  Advanced disc degeneration with disc space narrowing and spurring C3-C7.  Bilateral facet degeneration C3-4 and C4-5 and C5-6.  Negative for fracture.  IMPRESSION: Basilar vegetation with laxity of the  transverse ligament of C1.  Multilevel disc and facet degeneration.  Negative for fracture.   Original Report Authenticated By: Janeece Riggers, M.D.   Dg Chest Port 1 View  07/30/2013   *RADIOLOGY REPORT*  Clinical  Data: Vomiting  PORTABLE CHEST - 1 VIEW  Comparison: None.  Findings: The heart and pulmonary vascularity are within normal limits.  The lungs are well-aerated bilaterally.  Mild right basilar infiltrate is noted.  This could be related to aspiration pneumonia.  No bony abnormality is seen.  IMPRESSION: Mild right basilar infiltrate.   Original Report Authenticated By: Alcide Clever, M.D.   Dg Abd Portable 1v  07/30/2013   *RADIOLOGY REPORT*  Clinical Data: Nausea and vomiting.  PORTABLE ABDOMEN - 1 VIEW  Comparison: 11/22/2018  Findings: There is slight gaseous distention of the stomach.  There is only a minimal amount of air scattered throughout the nondistended large and small bowel.  There appears to be a faint infiltrate at the right lung base there is also vague density at the left lung base which may represent a small infiltrate.  There is a dense 6 cm irregular calcification in the pelvis of unknown origin. There is a similar density seen in the pelvis with prior radiograph of 11/22/2008.  This could be associated with the bladder.  CT scan may better define the etiology of this abnormality.  IMPRESSION:  1.  Slight gaseous distention of the stomach. 2.  Faint bibasilar pulmonary infiltrates. 3. Large irregular calcification in the pelvis.  This might represent an unusual large bladder stone.   Original Report Authenticated By: Francene Boyers, M.D.    Medications: Scheduled Meds: . heparin  5,000 Units Subcutaneous Q8H  . hydrocortisone  20 mg Oral BID  . potassium chloride  40 mEq Oral QID      LOS: 6 days   Christiane Ha M.D. [facility] 08/05/2013, 9:20 AM Pager: [phone]  If 7PM-7AM, please contact night-coverage [email or web address] Password [email or web address]

## 2013-08-06 LAB — CBC WITH DIFFERENTIAL/PLATELET
Basophils Absolute: 0 10*3/uL (ref 0.0–0.1)
Eosinophils Absolute: 0.1 10*3/uL (ref 0.0–0.7)
Eosinophils Relative: 1 % (ref 0–5)
HCT: 29.7 % — ABNORMAL LOW (ref 39.0–52.0)
Lymphocytes Relative: 15 % (ref 12–46)
Lymphs Abs: 1.5 10*3/uL (ref 0.7–4.0)
MCH: 34 pg (ref 26.0–34.0)
MCV: 97.1 fL (ref 78.0–100.0)
Monocytes Absolute: 1 10*3/uL (ref 0.1–1.0)
Platelets: 196 10*3/uL (ref 150–400)
RDW: 19 % — ABNORMAL HIGH (ref 11.5–15.5)

## 2013-08-06 LAB — BASIC METABOLIC PANEL
BUN: 8 mg/dL (ref 6–23)
CO2: 23 mEq/L (ref 19–32)
Calcium: 7 mg/dL — ABNORMAL LOW (ref 8.4–10.5)
GFR calc non Af Amer: 76 mL/min — ABNORMAL LOW (ref >90)
Glucose, Bld: 82 mg/dL (ref 70–99)

## 2013-08-06 MED ORDER — POTASSIUM CHLORIDE CRYS ER 20 MEQ PO TBCR: 20.0000 meq | EXTENDED_RELEASE_TABLET | Freq: Every day | ORAL | Status: DC

## 2013-08-06 MED ORDER — LOPERAMIDE HCL 2 MG PO CAPS: 2.0000 mg | ORAL_CAPSULE | Freq: Four times a day (QID) | ORAL | Status: AC | PRN

## 2013-08-06 MED ORDER — POTASSIUM CHLORIDE CRYS ER 20 MEQ PO TBCR
40.0000 meq | EXTENDED_RELEASE_TABLET | Freq: Two times a day (BID) | ORAL | Status: DC
  Administered 2013-08-06: 40 meq via ORAL

## 2013-08-06 NOTE — Discharge Summary (Signed)
Physician Discharge Summary  SADIEL MOTA ZOX:[id number] DOB: 1956-03-29 DOA: 07/30/2013  PCP: Willey Blade, MD  Admit date: 07/30/2013 Discharge date: 08/06/2013  Time spent: greater than 30 minutes  Recommendations:  If seizure recurs, start antiepileptic drug.  Check basic metabolic panel.  Discharge Diagnoses:  Principal Problem:   Hypokalemia Active Problems:   Acute renal failure   Nausea with vomiting   Diarrhea   Down syndrome   gastroenteritis   Adrenal insufficiency   UTI (urinary tract infection)   Convulsions/seizures, new. Single episode  Discharge Condition: stable  Filed Weights   07/31/13 0500 08/01/13 0500 08/02/13 0449  Weight: 51.1 kg (112 lb 10.5 oz) 55 kg (121 lb 4.1 oz) 54.6 kg (120 lb 5.9 oz)    History and hospital course:  Patient is a 57 year old male with history of Down syndrome, lives with mother, presented on 07/30/13 with nausea, vomiting, diarrhea, bradycardia, profound hypokalemia, K <2.0, acute renal insufficiency creatinine 2.1. Patient was admitted to ICU for stabilization and was cared for by critical care team. Required stress dose steroids. [facility] assumed care on 08/03/13.  Patient was found to have pansensitive Escherichia coli UTI, stool cultures are negative so far. He was initially started on ciprofloxacin and Flagyl. Flagyl was discontinued on 8/3. C diff PCR negative. 2-D echo showed EF of 55-60%, likely bradycardia due to profound hypokalemia.  Potassium eventually corrected.  Patient suffered a single seizure witnessed by nursing staff. With tonic clonic movements, eyes deviated and head turned.  CT brain showed nothing acute.  EEG attempted, but patient was unable to cooperate.  Discussed with Dr. Amada Jupiter who recommended no antiepileptic drugs unless another seizure suffered.  Could have been provoked by UTI, gastroenteritis with metabolic derangements.  By discharge, tolerating diet with minimal loose stool.  Electrolyte disturbances  corrected, steroids weaned off.  Procedures: None  Consultations:  PCCM  Discharge Exam: Filed Vitals:   08/06/13 0500  BP: 99/50  Pulse: 69  Temp: 97.9 F (36.6 C)  Resp: 18    General: alert. Appears comfortable Cardiovascular: RRR Respiratory: CTA  Discharge Instructions  Discharge Orders   Future Orders Complete By Expires     Discharge instructions  As directed     Comments:      Diet:  Dysphagia 3 diet    Increase activity slowly  As directed     Walk with assistance  As directed         Medication List         loperamide 2 MG capsule  Commonly known as:  IMODIUM  Take 1 capsule (2 mg total) by mouth 4 (four) times daily as needed for diarrhea or loose stools.     potassium chloride SA 20 MEQ tablet  Commonly known as:  K-DUR,KLOR-CON  Take 1 tablet (20 mEq total) by mouth daily.       No Known Allergies     Follow-up Information   Follow up with August Saucer, ERIC, MD. Schedule an appointment as soon as possible for a visit in 1 week. (For hospital followup, check BMET renal function on appointment)    Contact information:   [facility] [address] [place] [address] [phone]        The results of significant diagnostics from this hospitalization (including imaging, microbiology, ancillary and laboratory) are listed below for reference.    Significant Diagnostic Studies: Ct Head Wo Contrast  08/05/2013   *RADIOLOGY REPORT*  Clinical Data: Seizure activity, Down syndrome  CT HEAD WITHOUT CONTRAST  Technique:  Contiguous axial images were obtained from the base of the skull through the vertex without contrast.  Comparison: 07/30/2013  Findings: Stable brain atrophy and chronic microvascular ischemic changes.  Mild associated ventricular enlargement.  No acute intracranial hemorrhage, mass lesion, infarction, midline shift, herniation, or extra-axial fluid collection.  Cisterns patent. Cerebellar atrophy as well.  Dense  ossification of the anterior falx in the midline.  No skull fracture evident.  IMPRESSION: Stable atrophy and chronic microvascular ischemic changes.  No interval change.   Original Report Authenticated By: Judie Petit. Miles Costain, M.D.   Ct Head Wo Contrast  07/30/2013   *RADIOLOGY REPORT*  Clinical Data:  Fall  CT HEAD WITHOUT CONTRAST CT CERVICAL SPINE WITHOUT CONTRAST  Technique:  Multidetector CT imaging of the head and cervical spine was performed following the standard protocol without intravenous contrast.  Multiplanar CT image reconstructions of the cervical spine were also generated.  Comparison:   None  CT HEAD  Findings: Moderate atrophy.  Mild chronic microvascular ischemic change in the white matter.  No acute infarct, hemorrhage, or mass lesion.  Negative for skull fracture.  IMPRESSION: Atrophy and chronic microvascular ischemia.  No acute intracranial abnormality.  CT CERVICAL SPINE  Findings: There is transverse ligament laxity of C1 with widening of the atlanto dens interval and mild basilar invagination.  Advanced disc degeneration with disc space narrowing and spurring C3-C7.  Bilateral facet degeneration C3-4 and C4-5 and C5-6.  Negative for fracture.  IMPRESSION: Basilar vegetation with laxity of the  transverse ligament of C1.  Multilevel disc and facet degeneration.  Negative for fracture.   Original Report Authenticated By: Janeece Riggers, M.D.   Ct Cervical Spine Wo Contrast  07/30/2013   *RADIOLOGY REPORT*  Clinical Data:  Fall  CT HEAD WITHOUT CONTRAST CT CERVICAL SPINE WITHOUT CONTRAST  Technique:  Multidetector CT imaging of the head and cervical spine was performed following the standard protocol without intravenous contrast.  Multiplanar CT image reconstructions of the cervical spine were also generated.  Comparison:   None  CT HEAD  Findings: Moderate atrophy.  Mild chronic microvascular ischemic change in the white matter.  No acute infarct, hemorrhage, or mass lesion.  Negative for skull  fracture.  IMPRESSION: Atrophy and chronic microvascular ischemia.  No acute intracranial abnormality.  CT CERVICAL SPINE  Findings: There is transverse ligament laxity of C1 with widening of the atlanto dens interval and mild basilar invagination.  Advanced disc degeneration with disc space narrowing and spurring C3-C7.  Bilateral facet degeneration C3-4 and C4-5 and C5-6.  Negative for fracture.  IMPRESSION: Basilar vegetation with laxity of the  transverse ligament of C1.  Multilevel disc and facet degeneration.  Negative for fracture.   Original Report Authenticated By: Janeece Riggers, M.D.   Dg Chest Port 1 View  07/30/2013   *RADIOLOGY REPORT*  Clinical Data: Vomiting  PORTABLE CHEST - 1 VIEW  Comparison: None.  Findings: The heart and pulmonary vascularity are within normal limits.  The lungs are well-aerated bilaterally.  Mild right basilar infiltrate is noted.  This could be related to aspiration pneumonia.  No bony abnormality is seen.  IMPRESSION: Mild right basilar infiltrate.   Original Report Authenticated By: Alcide Clever, M.D.   Dg Abd Portable 1v  07/30/2013   *RADIOLOGY REPORT*  Clinical Data: Nausea and vomiting.  PORTABLE ABDOMEN - 1 VIEW  Comparison: 11/22/2018  Findings: There is slight gaseous distention of the stomach.  There is only a minimal amount  of air scattered throughout the nondistended large and small bowel.  There appears to be a faint infiltrate at the right lung base there is also vague density at the left lung base which may represent a small infiltrate.  There is a dense 6 cm irregular calcification in the pelvis of unknown origin. There is a similar density seen in the pelvis with prior radiograph of 11/22/2008.  This could be associated with the bladder.  CT scan may better define the etiology of this abnormality.  IMPRESSION:  1.  Slight gaseous distention of the stomach. 2.  Faint bibasilar pulmonary infiltrates. 3. Large irregular calcification in the pelvis.  This might  represent an unusual large bladder stone.   Original Report Authenticated By: Francene Boyers, M.D.    Microbiology: Recent Results (from the past 240 hour(s))  URINE CULTURE     Status: None   Collection Time    07/30/13  1:52 PM      Result Value Range Status   Specimen Description URINE, CLEAN CATCH   Final   Special Requests NONE   Final   Culture  Setup Time 07/30/2013 22:49   Final   Colony Count >=100,000 COLONIES/ML   Final   Culture ESCHERICHIA COLI   Final   Report Status 08/01/2013 FINAL   Final   Organism ID, Bacteria ESCHERICHIA COLI   Final  MRSA PCR SCREENING     Status: None   Collection Time    07/30/13  5:23 PM      Result Value Range Status   MRSA by PCR NEGATIVE  NEGATIVE Final   Comment:            The GeneXpert MRSA Assay (FDA     approved for NASAL specimens     only), is one component of a     comprehensive MRSA colonization     surveillance program. It is not     intended to diagnose MRSA     infection nor to guide or     monitor treatment for     MRSA infections.  STOOL CULTURE     Status: None   Collection Time    08/01/13  8:59 PM      Result Value Range Status   Specimen Description STOOL   Final   Special Requests NONE   Final   Culture     Final   Value: MODERATE YEAST     NO SALMONELLA, SHIGELLA, CAMPYLOBACTER, YERSINIA, OR E.COLI 0157:H7 ISOLATED     Note: REDUCED NORMAL FLORA PRESENT     Performed at [facility]   Report Status 08/05/2013 FINAL   Final  CLOSTRIDIUM DIFFICILE BY PCR     Status: None   Collection Time    08/03/13 11:25 AM      Result Value Range Status   C difficile by pcr NEGATIVE  NEGATIVE Final     Labs: Basic Metabolic Panel:  Recent Labs Lab 07/30/13 1843 07/31/13 0420 07/31/13 1516 08/01/13 0143  08/03/13 0600 08/04/13 0620 08/04/13 2309 08/05/13 1628 08/06/13 0550  NA 144 143 144 143  < > 142 142 142 145 144  K 2.0* <2.0* 2.4* 2.1*  < > 4.4 3.0* 3.2* 3.2* 3.6  CL 105 105 104 104  < > 109  108 104 109 112  CO2 27 27 23 26   < > 22 26 21 27 23   GLUCOSE 106* 73 95 111*  < > 104* 88 111* 112* 82  BUN 24*  24* 22 19  < > 10 10 8 9 8   CREATININE 1.90* 1.89* 1.76* 1.74*  < > 1.32 1.26 1.35 1.25 1.07  CALCIUM 7.3* 7.2* 7.1* 6.8*  < > 7.3* 7.1* 7.4* 7.0* 7.0*  MG 2.8* 2.7* 2.5 2.4  --  1.9  --   --  1.7 1.7  PHOS  --  1.8* 4.3 3.5  --  1.9*  --   --   --   --   < > = values in this interval not displayed. Liver Function Tests:  Recent Labs Lab 07/30/13 1315  AST 15  ALT 6  ALKPHOS 77  BILITOT 1.0  PROT 6.1  ALBUMIN 2.2*    Recent Labs Lab 07/30/13 1315  LIPASE 16   No results found for this basename: AMMONIA,  in the last 168 hours CBC:  Recent Labs Lab 07/30/13 1315  07/31/13 0420 08/01/13 0143 08/02/13 0530 08/03/13 0600 08/06/13 0550  WBC 15.7*  < > 14.0* 11.8* 11.3* 13.1* 10.0  NEUTROABS 14.1*  --   --   --   --   --  7.5  HGB 10.1*  < > 11.0* 11.2* 9.5* 11.0* 10.4*  HCT 26.9*  < > 29.2* 29.3* 25.7* 30.2* 29.7*  MCV 90.6  < > 90.7 89.1 91.1 93.8 97.1  PLT 209  < > 189 182 175 211 196  < > = values in this interval not displayed. Cardiac Enzymes: No results found for this basename: CKTOTAL, CKMB, CKMBINDEX, TROPONINI,  in the last 168 hours BNP: BNP (last 3 results) No results found for this basename: PROBNP,  in the last 8760 hours CBG:  Recent Labs Lab 07/30/13 1240 07/30/13 1451  GLUCAP 90 84   EKG sinus bradycardia  Echo: Left ventricle: The cavity size was normal. Wall thickness was normal. Systolic function was normal. The estimated ejection fraction was in the range of 55% to 60%. Wall motion was normal; there were no regional wall motion abnormalities. Left ventricular diastolic function parameters were normal. - Aortic valve: Trivial regurgitation. - Pericardium, extracardiac: A small pericardial effusion was identified  Signed:  Aneta Hendershott L  [facility] 08/06/2013, 12:08 PM

## 2014-10-15 IMAGING — CR DG ABD PORTABLE 1V
2 series · 2 of 2 positions shown · non-contrast
Comparison: 11/22/2018

CLINICAL DATA: Nausea and vomiting.

PORTABLE ABDOMEN - 1 VIEW

[AP (1 of 2)]
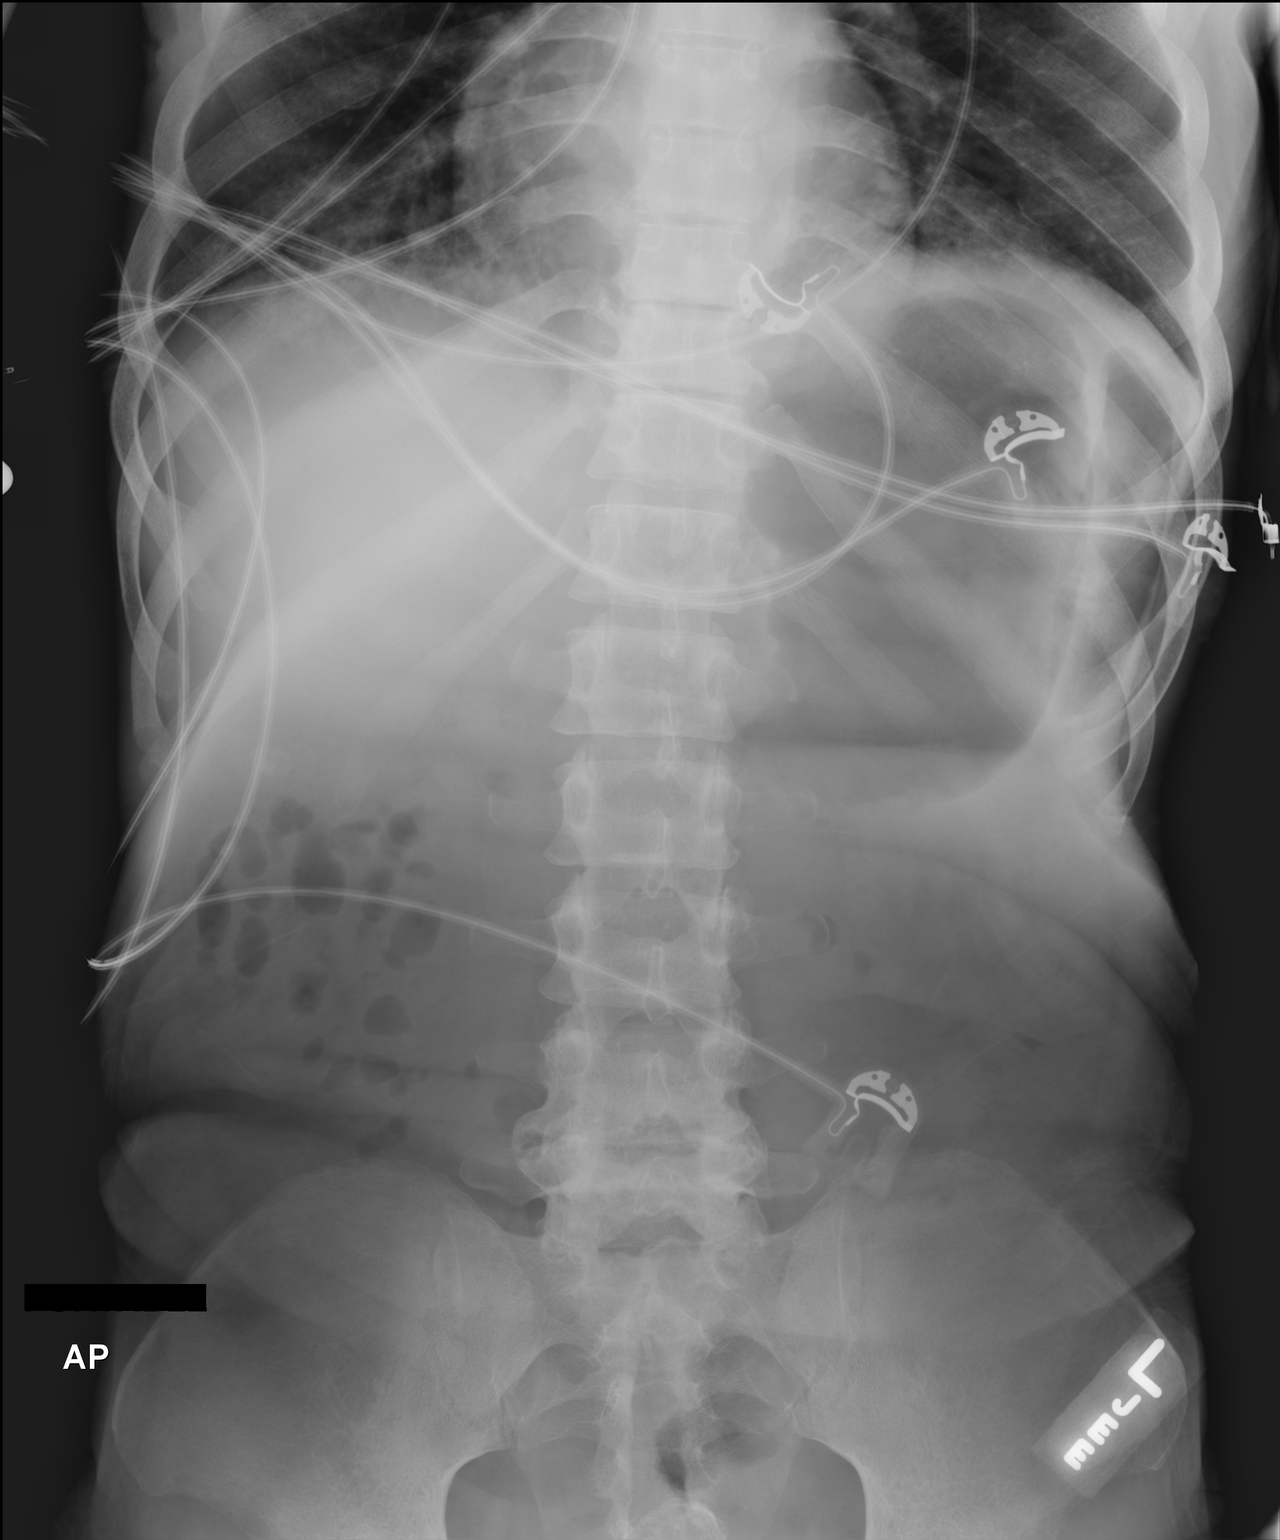

[AP (2 of 2)]
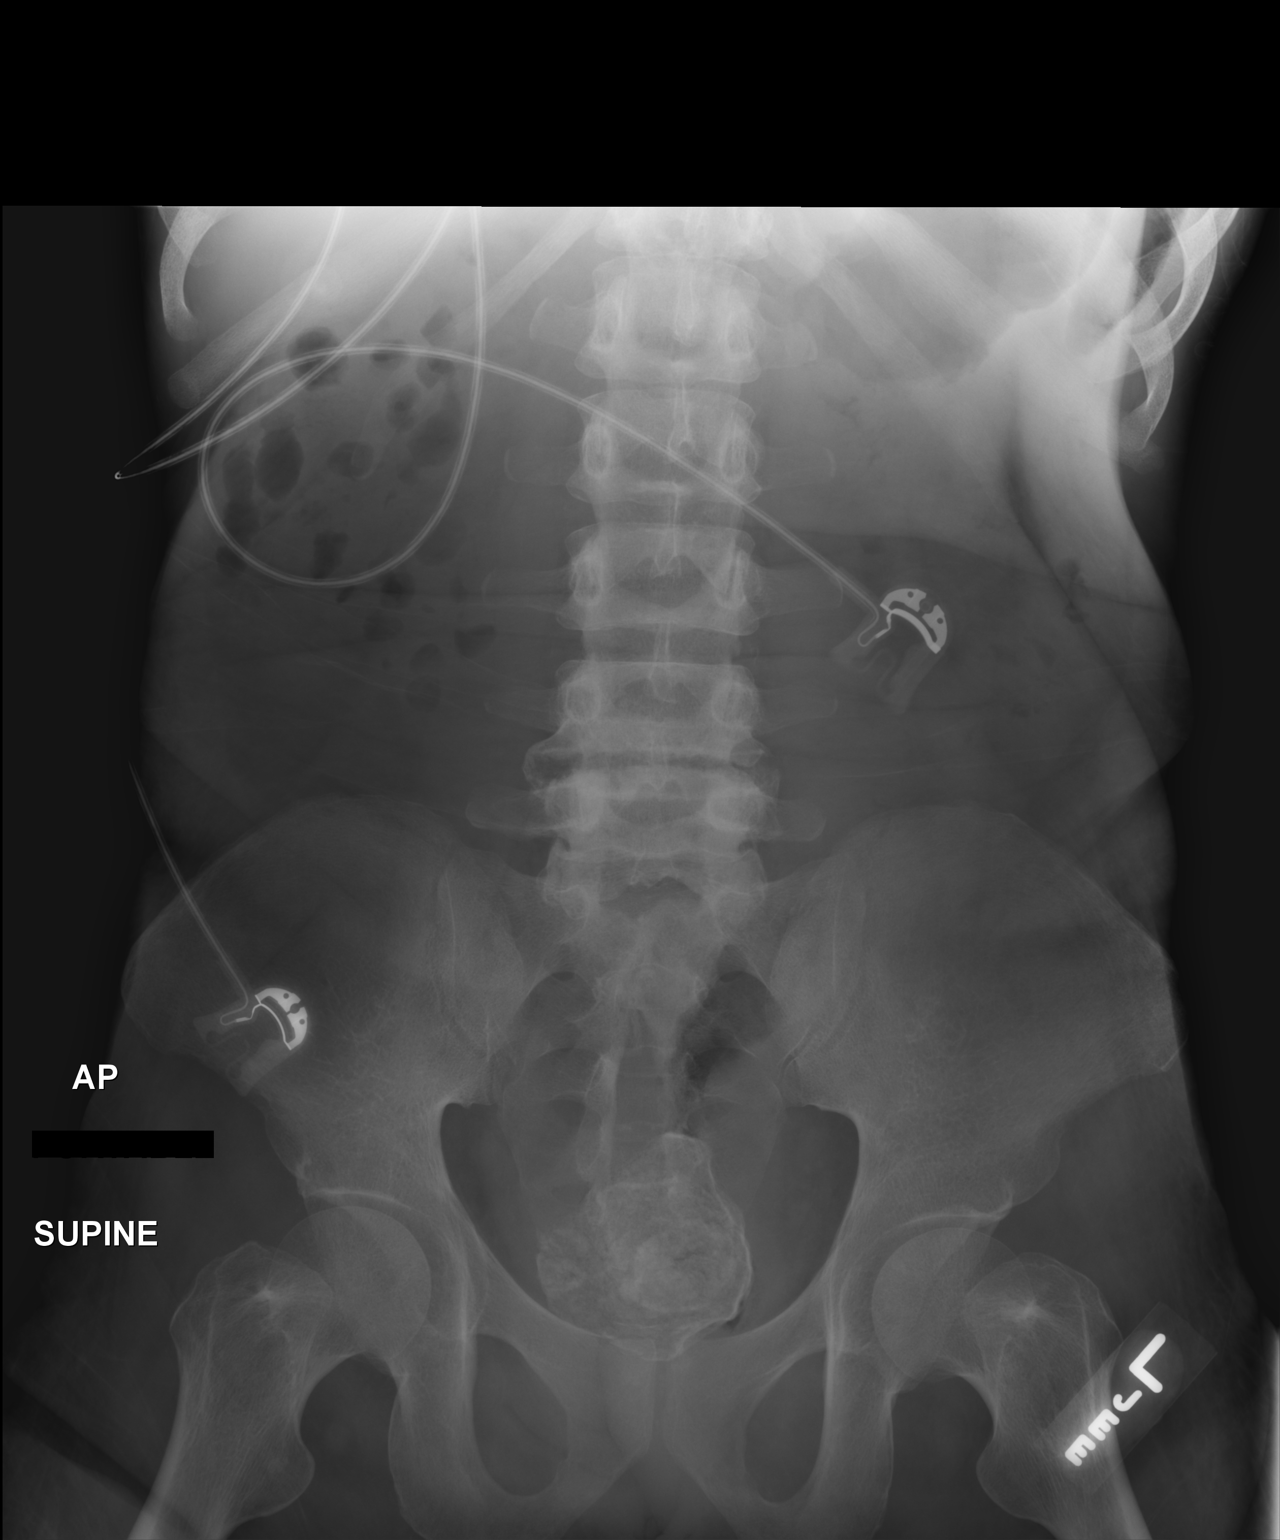

[2 of 2 positions shown; findings below may reference images not displayed]

FINDINGS: There is slight gaseous distention of the stomach.  There
is only a minimal amount of air scattered throughout the
nondistended large and small bowel.

There appears to be a faint infiltrate at the right lung base there
is also vague density at the left lung base which may represent a
small infiltrate.

There is a dense 6 cm irregular calcification in the pelvis of
unknown origin. There is a similar density seen in the pelvis with
prior radiograph of 11/22/2008.  This could be associated with the
bladder.  CT scan may better define the etiology of this
abnormality.
IMPRESSION: 1.  Slight gaseous distention of the stomach.
2.  Faint bibasilar pulmonary infiltrates.
3. Large irregular calcification in the pelvis.  This might
represent an unusual large bladder stone.

## 2015-03-15 ENCOUNTER — Other Ambulatory Visit: Payer: Self-pay | Admitting: Internal Medicine

## 2015-03-15 ENCOUNTER — Ambulatory Visit
Admission: RE | Admit: 2015-03-15 | Discharge: 2015-03-15 | Disposition: A | Discharge status: Home / Self Care | Payer: Medicare Other | Source: Ambulatory Visit | Attending: Internal Medicine | Admitting: Internal Medicine

## 2015-03-15 DIAGNOSIS — Z1383 Encounter for screening for respiratory disorder NEC: Secondary | ICD-10-CM

## 2018-12-07 ENCOUNTER — Encounter (HOSPITAL_COMMUNITY): Payer: Self-pay | Admitting: *Deleted

## 2018-12-07 ENCOUNTER — Emergency Department (HOSPITAL_COMMUNITY): Payer: Medicare Other

## 2018-12-07 ENCOUNTER — Other Ambulatory Visit: Payer: Self-pay

## 2018-12-07 ENCOUNTER — Inpatient Hospital Stay (HOSPITAL_COMMUNITY)
Admission: EM | Admit: 2018-12-07 | Discharge: 2018-12-10 | DRG: 683 | Disposition: A | Discharge status: Home / Self Care | Payer: Medicare Other | Attending: Infectious Disease | Admitting: Infectious Disease

## 2018-12-07 DIAGNOSIS — A084 Viral intestinal infection, unspecified: Secondary | ICD-10-CM | POA: Diagnosis present

## 2018-12-07 DIAGNOSIS — N179 Acute kidney failure, unspecified: Secondary | ICD-10-CM | POA: Diagnosis present

## 2018-12-07 DIAGNOSIS — E860 Dehydration: Secondary | ICD-10-CM | POA: Insufficient documentation

## 2018-12-07 DIAGNOSIS — E872 Acidosis: Secondary | ICD-10-CM | POA: Diagnosis present

## 2018-12-07 DIAGNOSIS — A419 Sepsis, unspecified organism: Secondary | ICD-10-CM | POA: Diagnosis not present

## 2018-12-07 DIAGNOSIS — F79 Unspecified intellectual disabilities: Secondary | ICD-10-CM | POA: Diagnosis present

## 2018-12-07 DIAGNOSIS — I959 Hypotension, unspecified: Secondary | ICD-10-CM | POA: Diagnosis present

## 2018-12-07 DIAGNOSIS — E785 Hyperlipidemia, unspecified: Secondary | ICD-10-CM | POA: Diagnosis present

## 2018-12-07 DIAGNOSIS — R195 Other fecal abnormalities: Secondary | ICD-10-CM | POA: Diagnosis present

## 2018-12-07 DIAGNOSIS — E861 Hypovolemia: Secondary | ICD-10-CM | POA: Diagnosis not present

## 2018-12-07 DIAGNOSIS — E2740 Unspecified adrenocortical insufficiency: Secondary | ICD-10-CM | POA: Diagnosis present

## 2018-12-07 DIAGNOSIS — E7800 Pure hypercholesterolemia, unspecified: Secondary | ICD-10-CM | POA: Diagnosis present

## 2018-12-07 DIAGNOSIS — M1990 Unspecified osteoarthritis, unspecified site: Secondary | ICD-10-CM | POA: Diagnosis present

## 2018-12-07 DIAGNOSIS — R6510 Systemic inflammatory response syndrome (SIRS) of non-infectious origin without acute organ dysfunction: Secondary | ICD-10-CM | POA: Diagnosis present

## 2018-12-07 DIAGNOSIS — E875 Hyperkalemia: Secondary | ICD-10-CM | POA: Diagnosis present

## 2018-12-07 DIAGNOSIS — M109 Gout, unspecified: Secondary | ICD-10-CM | POA: Diagnosis present

## 2018-12-07 DIAGNOSIS — Q909 Down syndrome, unspecified: Secondary | ICD-10-CM | POA: Diagnosis not present

## 2018-12-07 DIAGNOSIS — M1A9XX0 Chronic gout, unspecified, without tophus (tophi): Secondary | ICD-10-CM | POA: Diagnosis present

## 2018-12-07 DIAGNOSIS — R197 Diarrhea, unspecified: Secondary | ICD-10-CM | POA: Diagnosis not present

## 2018-12-07 DIAGNOSIS — N182 Chronic kidney disease, stage 2 (mild): Secondary | ICD-10-CM | POA: Diagnosis present

## 2018-12-07 LAB — LACTIC ACID, PLASMA: Lactic Acid, Venous: 1.3 mmol/L (ref 0.5–1.9)

## 2018-12-07 LAB — MAGNESIUM: Magnesium: 2.1 mg/dL (ref 1.7–2.4)

## 2018-12-07 LAB — COMPREHENSIVE METABOLIC PANEL
ALT: 16 U/L (ref 0–44)
AST: 37 U/L (ref 15–41)
Albumin: 3.4 g/dL — ABNORMAL LOW (ref 3.5–5.0)
Alkaline Phosphatase: 75 U/L (ref 38–126)
Anion gap: 10 (ref 5–15)
BUN: 34 mg/dL — ABNORMAL HIGH (ref 8–23)
CALCIUM: 8.6 mg/dL — AB (ref 8.9–10.3)
CO2: 22 mmol/L (ref 22–32)
Chloride: 108 mmol/L (ref 98–111)
Creatinine, Ser: 1.54 mg/dL — ABNORMAL HIGH (ref 0.61–1.24)
GFR calc Af Amer: 56 mL/min — ABNORMAL LOW (ref >60)
GFR calc non Af Amer: 48 mL/min — ABNORMAL LOW (ref >60)
Glucose, Bld: 111 mg/dL — ABNORMAL HIGH (ref 70–99)
Potassium: 5.4 mmol/L — ABNORMAL HIGH (ref 3.5–5.1)
Sodium: 140 mmol/L (ref 135–145)
Total Bilirubin: 1.1 mg/dL (ref 0.3–1.2)
Total Protein: 7.2 g/dL (ref 6.5–8.1)

## 2018-12-07 LAB — URINALYSIS, ROUTINE W REFLEX MICROSCOPIC
Bilirubin Urine: NEGATIVE
Glucose, UA: NEGATIVE mg/dL
Hgb urine dipstick: NEGATIVE
Ketones, ur: NEGATIVE mg/dL
Leukocytes, UA: NEGATIVE
Nitrite: NEGATIVE
PROTEIN: NEGATIVE mg/dL
Specific Gravity, Urine: 1.02 (ref 1.005–1.030)
pH: 5 (ref 5.0–8.0)

## 2018-12-07 LAB — CBC
HCT: 40.5 % (ref 39.0–52.0)
Hemoglobin: 13.4 g/dL (ref 13.0–17.0)
MCH: 33.5 pg (ref 26.0–34.0)
MCHC: 33.1 g/dL (ref 30.0–36.0)
MCV: 101.3 fL — ABNORMAL HIGH (ref 80.0–100.0)
Platelets: 225 10*3/uL (ref 150–400)
RBC: 4 MIL/uL — ABNORMAL LOW (ref 4.22–5.81)
RDW: 14.4 % (ref 11.5–15.5)
WBC: 11.9 10*3/uL — ABNORMAL HIGH (ref 4.0–10.5)
nRBC: 0 % (ref 0.0–0.2)

## 2018-12-07 LAB — C DIFFICILE QUICK SCREEN W PCR REFLEX
C Diff antigen: NEGATIVE
C Diff interpretation: NOT DETECTED
C Diff toxin: NEGATIVE

## 2018-12-07 LAB — BLOOD GAS, VENOUS
Acid-base deficit: 3.1 mmol/L — ABNORMAL HIGH (ref 0.0–2.0)
Bicarbonate: 23.8 mmol/L (ref 20.0–28.0)
O2 Saturation: 48.4 %
Patient temperature: 98.6
pCO2, Ven: 52.8 mmHg (ref 44.0–60.0)
pH, Ven: 7.276 (ref 7.250–7.430)

## 2018-12-07 LAB — I-STAT CG4 LACTIC ACID, ED
LACTIC ACID, VENOUS: 3.6 mmol/L — AB (ref 0.5–1.9)
Lactic Acid, Venous: 1.69 mmol/L (ref 0.5–1.9)

## 2018-12-07 LAB — PROTIME-INR
INR: 1
PROTHROMBIN TIME: 13.1 s (ref 11.4–15.2)

## 2018-12-07 LAB — PROCALCITONIN: Procalcitonin: 0.1 ng/mL

## 2018-12-07 LAB — I-STAT TROPONIN, ED: Troponin i, poc: 0 ng/mL (ref 0.00–0.08)

## 2018-12-07 LAB — LIPASE, BLOOD: Lipase: 27 U/L (ref 11–51)

## 2018-12-07 MED ORDER — LOPERAMIDE HCL 2 MG PO CAPS
2.0000 mg | ORAL_CAPSULE | Freq: Four times a day (QID) | ORAL | Status: DC | PRN
  Administered 2018-12-08 (×2): 2 mg via ORAL
  Filled 2018-12-07 (×2): qty 1

## 2018-12-07 MED ORDER — SODIUM CHLORIDE 0.9 % IV BOLUS: 1000.0000 mL | Freq: Once | INTRAVENOUS | Status: DC

## 2018-12-07 MED ORDER — ONDANSETRON HCL 4 MG PO TABS: 4.0000 mg | ORAL_TABLET | Freq: Four times a day (QID) | ORAL | Status: DC | PRN

## 2018-12-07 MED ORDER — ALLOPURINOL 100 MG PO TABS
100.0000 mg | ORAL_TABLET | Freq: Every day | ORAL | Status: DC
  Administered 2018-12-08 – 2018-12-10 (×3): 100 mg via ORAL
  Filled 2018-12-07 (×3): qty 1

## 2018-12-07 MED ORDER — SODIUM CHLORIDE 0.9 % IV BOLUS
1000.0000 mL | Freq: Once | INTRAVENOUS | Status: AC
  Administered 2018-12-07: 1000 mL via INTRAVENOUS

## 2018-12-07 MED ORDER — ONDANSETRON HCL 4 MG/2ML IJ SOLN: 4.0000 mg | Freq: Four times a day (QID) | INTRAMUSCULAR | Status: DC | PRN

## 2018-12-07 MED ORDER — SODIUM CHLORIDE 0.9 % IV BOLUS: 500.0000 mL | Freq: Once | INTRAVENOUS | Status: DC

## 2018-12-07 MED ORDER — ENOXAPARIN SODIUM 40 MG/0.4ML ~~LOC~~ SOLN
40.0000 mg | SUBCUTANEOUS | Status: DC
  Administered 2018-12-08: 40 mg via SUBCUTANEOUS
  Filled 2018-12-07: qty 0.4

## 2018-12-07 MED ORDER — HYDROCORTISONE NA SUCCINATE PF 100 MG IJ SOLR
100.0000 mg | Freq: Once | INTRAMUSCULAR | Status: AC
  Administered 2018-12-07: 100 mg via INTRAVENOUS
  Filled 2018-12-07: qty 2

## 2018-12-07 MED ORDER — RISPERIDONE 0.25 MG PO TABS
0.5000 mg | ORAL_TABLET | Freq: Two times a day (BID) | ORAL | Status: DC
  Administered 2018-12-07 – 2018-12-10 (×6): 0.5 mg via ORAL
  Filled 2018-12-07: qty 2
  Filled 2018-12-07: qty 1
  Filled 2018-12-07 (×2): qty 2
  Filled 2018-12-07: qty 1
  Filled 2018-12-07: qty 2

## 2018-12-07 MED ORDER — ACETAMINOPHEN 325 MG PO TABS: 650.0000 mg | ORAL_TABLET | Freq: Four times a day (QID) | ORAL | Status: DC | PRN

## 2018-12-07 MED ORDER — SODIUM CHLORIDE 0.9 % IV SOLN
INTRAVENOUS | Status: DC
  Administered 2018-12-07: 21:00:00 via INTRAVENOUS

## 2018-12-07 MED ORDER — HYDROCORTISONE NA SUCCINATE PF 100 MG IJ SOLR
50.0000 mg | Freq: Four times a day (QID) | INTRAMUSCULAR | Status: DC
  Administered 2018-12-08 (×2): 50 mg via INTRAVENOUS
  Filled 2018-12-07 (×2): qty 2

## 2018-12-07 MED ORDER — SODIUM CHLORIDE 0.9 % IV BOLUS
500.0000 mL | Freq: Once | INTRAVENOUS | Status: AC
  Administered 2018-12-07: 500 mL via INTRAVENOUS

## 2018-12-07 MED ORDER — ACETAMINOPHEN 650 MG RE SUPP: 650.0000 mg | Freq: Four times a day (QID) | RECTAL | Status: DC | PRN

## 2018-12-07 MED ORDER — SODIUM CHLORIDE 0.9 % IV SOLN: Freq: Once | INTRAVENOUS | Status: DC

## 2018-12-07 NOTE — H&P (Addendum)
History and Physical    Richard Greene:[id number] DOB: 01/07/1956 DOA: 12/07/2018  Referring MD/NP/PA:   PCP: Marletta Lor, NP   Patient coming from:  The patient is coming from group home.  At baseline, pt is dependent for most of ADL.        Chief Complaint: diarrhea and hypotension  HPI: Richard Greene is a 62 y.o. male with medical history significant of Down syndrome, mental retardation, nonverbal status at baseline, hyperlipidemia, gout, CKD-2, adrenal insufficiency, who presents with diarrhea.  Per manager from group home, patient was noted to have been having diarrhea in the past 36 hours in facility.  He has had watery diarrhea for more than 10 times today.  No obvious nausea or vomiting noted.  Patient does not seem to have abdominal pain.  His blood pressure is low, 80s/60s in facility.  His mental status is at baseline, nonverbal status.  No active cough, respiratory distress, shortness of breath noted.  Patient temperature is 99.1 in the ED.  His initial blood pressure is 82/47, which improved to 90/50 after giving 1.5 L normal saline bolus, but then decreased to upper 80s of SBP in the ED.    ED Course: pt was found to have heart rate 80s, no tachypnea, oxygen saturation 97% on room air, temperature 99.1, WBC 11.9, lactic acid 3.6, K 5.4, negative troponin, lipase 27, negative urinalysis, C. difficile PCR negative.  Potassium 5.4, worsen ing renal function.    X-ray of acute chest/abdomen: 1. No acute cardiopulmonary process. 2. Moderate volume retained large bowel stool. Normal bowel gas pattern. 3. Similar calcification projecting in pelvis.  Review of Systems: Could not be reviewed due to Down syndrome and mental retardation  Allergy: No Known Allergies  Past Medical History:  Diagnosis Date  . Constipation   . Down syndrome   . Dyspepsia   . Gout, unspecified   . Hypokalemia 07/2013  . Obesity, unspecified   . Osteoarthrosis, unspecified whether generalized or  localized, unspecified site   . Pure hypercholesterolemia     Past Surgical History:  Procedure Laterality Date  . NO PAST SURGERIES      Social History:  reports that he has never smoked. He has never used smokeless tobacco. He reports that he does not drink alcohol or use drugs.  Family History: Could not be reviewed due to Down syndrome and mental retardation.  Prior to Admission medications   Medication Sig Start Date End Date Taking? Authorizing Provider  allopurinol (ZYLOPRIM) 100 MG tablet Take 100 mg by mouth daily. 11/24/18  Yes [provider]  loperamide (IMODIUM) 2 MG capsule Take 1 capsule (2 mg total) by mouth 4 (four) times daily as needed for diarrhea or loose stools. 08/06/13  Yes Christiane Ha, MD  risperiDONE (RISPERDAL) 0.5 MG tablet Take 0.5 mg by mouth 2 (two) times daily. 11/15/18  Yes [provider]    Physical Exam: Vitals:   12/08/18 0115 12/08/18 0130 12/08/18 0145 12/08/18 0200  BP: (!) 89/56 (!) 85/60 (!) 90/52 (!) 82/58  Pulse: 87 80 79 64  Resp: 17 16 11 10   Temp:      TempSrc:      SpO2: 99% 100% 99% 100%   General: Not in acute distress.  Dry mucous membrane HEENT:       Eyes: PERRL, EOMI, no scleral icterus.       ENT: No discharge from the ears and nose, no pharynx injection, no tonsillar enlargement.  Neck: No JVD, no bruit, no mass felt. Heme: No neck lymph node enlargement. Cardiac: S1/S2, RRR, No murmurs, No gallops or rubs. Respiratory: No rales, wheezing, rhonchi or rubs. GI: Soft, nondistended, nontender, no rebound pain, no organomegaly, BS present. GU: No hematuria Ext: No pitting leg edema bilaterally. 2+DP/PT pulse bilaterally. Musculoskeletal: No joint deformities, No joint redness or warmth, no limitation of ROM in spin. Skin: No rashes.  Neuro: Alert, non-verbal, cranial nerves II-XII grossly intact, moves all extremities.  Psych: Patient is not psychotic, no suicidal or hemocidal  ideation.  Labs on Admission: I have personally reviewed following labs and imaging studies  CBC: Recent Labs  Lab 12/07/18 1634  WBC 11.9*  HGB 13.4  HCT 40.5  MCV 101.3*  PLT 225   Basic Metabolic Panel: Recent Labs  Lab 12/07/18 1634 12/07/18 2107 12/08/18 0118  NA 140  --  140  K 5.4*  --  5.1  CL 108  --  112*  CO2 22  --  21*  GLUCOSE 111*  --  113*  BUN 34*  --  30*  CREATININE 1.54*  --  1.23  CALCIUM 8.6*  --  7.6*  MG  --  2.1  --    GFR: CrCl cannot be calculated (Unknown ideal weight.). Liver Function Tests: Recent Labs  Lab 12/07/18 1634  AST 37  ALT 16  ALKPHOS 75  BILITOT 1.1  PROT 7.2  ALBUMIN 3.4*   Recent Labs  Lab 12/07/18 1634  LIPASE 27   No results for input(s): AMMONIA in the last 168 hours. Coagulation Profile: Recent Labs  Lab 12/07/18 2107  INR 1.00   Cardiac Enzymes: No results for input(s): CKTOTAL, CKMB, CKMBINDEX, TROPONINI in the last 168 hours. BNP (last 3 results) No results for input(s): PROBNP in the last 8760 hours. HbA1C: No results for input(s): HGBA1C in the last 72 hours. CBG: No results for input(s): GLUCAP in the last 168 hours. Lipid Profile: No results for input(s): CHOL, HDL, LDLCALC, TRIG, CHOLHDL, LDLDIRECT in the last 72 hours. Thyroid Function Tests: No results for input(s): TSH, T4TOTAL, FREET4, T3FREE, THYROIDAB in the last 72 hours. Anemia Panel: No results for input(s): VITAMINB12, FOLATE, FERRITIN, TIBC, IRON, RETICCTPCT in the last 72 hours. Urine analysis:    Component Value Date/Time   COLORURINE YELLOW 12/07/2018 1634   APPEARANCEUR CLEAR 12/07/2018 1634   LABSPEC 1.020 12/07/2018 1634   PHURINE 5.0 12/07/2018 1634   GLUCOSEU NEGATIVE 12/07/2018 1634   HGBUR NEGATIVE 12/07/2018 1634   BILIRUBINUR NEGATIVE 12/07/2018 1634   KETONESUR NEGATIVE 12/07/2018 1634   PROTEINUR NEGATIVE 12/07/2018 1634   UROBILINOGEN 0.2 07/30/2013 1352   NITRITE NEGATIVE 12/07/2018 1634   LEUKOCYTESUR  NEGATIVE 12/07/2018 1634   Sepsis Labs: @LABRCNTIP (procalcitonin:4,lacticidven:4) ) Recent Results (from the past 240 hour(s))  C difficile quick scan w PCR reflex     Status: None   Collection Time: 12/07/18  4:40 PM  Result Value Ref Range Status   C Diff antigen NEGATIVE NEGATIVE Final   C Diff toxin NEGATIVE NEGATIVE Final   C Diff interpretation No C. difficile detected.  Final    Comment: Performed at [facility], [address] [place], [place] [address]     Radiological Exams on Admission: Dg Abdomen Acute W/chest  Result Date: 12/07/2018 CLINICAL DATA:  Diarrhea. EXAM: DG ABDOMEN ACUTE W/ 1V CHEST COMPARISON:  Chest radiograph March 05, 2015 and abdominal radiograph July 30, 2013. FINDINGS: Cardiomediastinal silhouette is normal. Lungs are clear,  no pleural effusions. No pneumothorax. Soft tissue planes and included osseous structures are unremarkable. Bowel gas pattern is nondilated and nonobstructive. Moderate volume retained large bowel stool. No intra-abdominal mass effect, or free air. Relatively stable at least 6 cm calcified mass projecting central pelvis. Soft tissue planes and included osseous structures are non-suspicious. IMPRESSION: 1. No acute cardiopulmonary process. 2. Moderate volume retained large bowel stool. Normal bowel gas pattern. 3. Similar calcification projecting in pelvis. Electronically Signed   By: Awilda Metro M.D.   On: 12/07/2018 19:15     EKG: Independently reviewed.  Sinus rhythm, QTC 460, early R wave progression, nonspecific T wave change.   Assessment/Plan Principal Problem:   Diarrhea Active Problems:   Gout   Down syndrome   Adrenal insufficiency (HCC)   Sepsis (HCC)   Hyperkalemia   Acute renal failure superimposed on stage 2 chronic kidney disease (HCC)   Occult blood positive stool   Diarrhea and sepsis: Patient has severe diarrhea, etiology is not clear.  C. difficile PCR is negative.  Likely due to  viral enteritis.  Patient meets criteria for sepsis with leukocytosis and hypotension.  Lactic acid is elevated at 3.6.  Patient has hypotension, which responded to IV fluid partially, still has soft blood pressure after 1.5 L normal saline bolus.  Patient has history of adrenal insufficiency.  100 mg of Solu-Cortef was given (cortisol level was not checked)  -will be admitted to stepdown bed as inpatient -IV fluid: 1.5 L of normal saline was given, will give another liter normal saline bolus, then 125 cc/h--> bolus as needed. -If blood pressure is not maintained, will consult PCCM to start vasopressor. -Stress dose Solu-Cortef: 100 mg once Solu-Cortef was given, will continue 60 mg every 6 hours -f/u GI path panel which is orderred by EDP  Gout: -continue home allopurinol  Down syndrome and MR: Nonverbal status at baseline. -Mental status is at baseline per group home manager  Hyperkalemia: K=5.4. Likely due to dehydration and worsening renal function -expect correction with IV fluid  Hx of Adrenal insufficiency (HCC): -on stress dose Solu-Cortef as well -Recommend outpatient follow-up (Solu-Cortef was given in ED, but cortisol level was not checked)  Acute renal failure superimposed on stage 2 chronic kidney disease (HCC): Baseline Cre is ~1.0, pt's Cre is 1.54 and BUN 34 on admission. Likely due to prerenal secondary to dehydration. - IVF as above - Follow up renal function by BMP -Avoid using renal toxic medications.  Addendum: FOBT comes back positive. Hgb normal, 13.4. Likely due to enteritis. -will f/u CBC -start protonix 40 mg bid   Inpatient status:  # Patient requires inpatient status due to high intensity of service, high risk for further deterioration and high frequency of surveillance required.  I certify that at the point of admission it is my clinical judgment that the patient will require inpatient hospital care spanning beyond 2 midnights from the point of  admission.  . This patient has multiple chronic comorbidities including Down syndrome, mental retardation, nonverbal status at baseline, hyperlipidemia, gout, CKD-2, adrenal insufficiency,  . Now patient has presenting symptoms include diarrhea . The worrisome physical exam findings include dry mucous and membrane, hypotension . the initial radiographic and laboratory data are worrisome because of elevated lactic acid level, leukocytosis, hypokalemia, worsening renal function . Current medical needs: please see my assessment and plan . Predictability of an adverse outcome (risk): Patient has severe sepsis, likely due to viral gastritis. He also has history of adrenal insufficiency, blood  pressure is still soft after finishing normal saline bolus, may need vasopressor if blood pressure cannot be maintained with IV fluid.  Patient is at high risk of deteriorating, will need to stay in hospital for at least 2 days.   DVT ppx:  SCD Code Status: Full code per manager from group home Family Communication:   Yes, manager from group home Disposition Plan:  Anticipate discharge back to previous group home environment Consults called:  none Admission status: SDU/inpation       Date of Service 12/08/2018    Lorretta HarpXilin Angelea Penny [facility] Pager [phone]  If 7PM-7AM, please contact night-coverage [email or web address] Password [email or web address] 12/08/2018, 2:25 AM

## 2018-12-07 NOTE — ED Notes (Signed)
Bed: WA15 Expected date:  Expected time:  Means of arrival:  Comments: EMS-hypotensive 

## 2018-12-07 NOTE — ED Provider Notes (Signed)
[facility]-EMERGENCY DEPT Provider Note   CSN: [id number] Arrival date & time: 12/07/18  1602     History   Chief Complaint Chief Complaint  Patient presents with  . Diarrhea  . Hypotension    HPI Richard Greene is a 62 y.o. male.  62 year old male with prior medical history as detailed below presents for evaluation of profuse watery diarrhea.  Patient is nonverbal at baseline.  Patient with prior history of Down syndrome.  Patient reportedly with profound diarrhea over the last 36 hours.  No reported fever.  No reported nausea or vomiting.  No reported blood in the stool.  No recent reported hospitalizations.  No recent use of antibiotics reported.   History is obtained from caregiver who is at bedside.  Again patient is nonverbal at baseline.  Per caregiver, his mental status appears unchanged.  Chart review reveals similar in presentation in 2014.  Patient was significantly more ill at that time.  He was treated for adrenal insufficiency, hypokalemia, bradycardia, and profound dehydration.  He did require ICU admission at that time.  The history is provided by the patient, medical records and a caregiver.  Diarrhea   This is a new problem. The current episode started yesterday. The problem occurs more than 10 times per day. The problem has not changed since onset.The stool consistency is described as watery and mucous. There has been no fever. Pertinent negatives include no vomiting.    Past Medical History:  Diagnosis Date  . Constipation   . Down syndrome   . Dyspepsia   . Gout, unspecified   . Hypokalemia 07/2013  . Obesity, unspecified   . Osteoarthrosis, unspecified whether generalized or localized, unspecified site   . Pure hypercholesterolemia     Patient Active Problem List   Diagnosis Date Noted  . Convulsions/seizures (HCC) 08/05/2013  . Hypokalemia 08/03/2013  . Acute renal failure (HCC) 08/03/2013  . Nausea with vomiting 08/03/2013  .  Diarrhea 08/03/2013  . Down syndrome 08/03/2013  . gastroenteritis and colitis 08/03/2013  . Adrenal insufficiency (HCC) 08/03/2013  . UTI (urinary tract infection) 08/03/2013  . Constipation 12/14/2012  . Gout 12/14/2012  . Colonic dysfunction 12/14/2012  . Degenerative joint disease 12/14/2012  . Periodontal disease 12/14/2012  . Mental retardation 12/14/2012    Past Surgical History:  Procedure Laterality Date  . NO PAST SURGERIES          Home Medications    Prior to Admission medications   Medication Sig Start Date End Date Taking? Authorizing Provider  allopurinol (ZYLOPRIM) 100 MG tablet Take 100 mg by mouth daily. 11/24/18  Yes [provider]  loperamide (IMODIUM) 2 MG capsule Take 1 capsule (2 mg total) by mouth 4 (four) times daily as needed for diarrhea or loose stools. 08/06/13  Yes Christiane Ha, MD  risperiDONE (RISPERDAL) 0.5 MG tablet Take 0.5 mg by mouth 2 (two) times daily. 11/15/18  Yes [provider]    Family History No family history on file.  Social History Social History   Tobacco Use  . Smoking status: Never Smoker  . Smokeless tobacco: Never Used  Substance Use Topics  . Alcohol use: No  . Drug use: No     Allergies   Patient has no known allergies.   Review of Systems Review of Systems  Unable to perform ROS: Patient nonverbal  Gastrointestinal: Positive for diarrhea. Negative for vomiting.     Physical Exam Updated Vital Signs BP (!) 82/47  Pulse 87   Temp 99.1 F (37.3 C) (Rectal)   Resp 15   SpO2 98%   Physical Exam  Constitutional: He appears well-developed. No distress.  Elderly Down syndrome patient    HENT:  Head: Normocephalic and atraumatic.  Mouth/Throat: Oropharynx is clear and moist.  Eyes: Pupils are equal, round, and reactive to light. Conjunctivae and EOM are normal.  Neck: Normal range of motion. Neck supple.  Cardiovascular: Normal rate, regular rhythm and normal heart  sounds.  Pulmonary/Chest: Effort normal and breath sounds normal. No respiratory distress.  Abdominal: Soft. He exhibits no distension. There is no tenderness.  Musculoskeletal: Normal range of motion. He exhibits no edema or deformity.  Neurological: He is alert.  Nonverbal at baseline  Skin: Skin is warm and dry.  Psychiatric: He has a normal mood and affect.  Nursing note and vitals reviewed.    ED Treatments / Results  Labs (all labs ordered are listed, but only abnormal results are displayed) Labs Reviewed  COMPREHENSIVE METABOLIC PANEL - Abnormal; Notable for the following components:      Result Value   Potassium 5.4 (*)    Glucose, Bld 111 (*)    BUN 34 (*)    Creatinine, Ser 1.54 (*)    Calcium 8.6 (*)    Albumin 3.4 (*)    GFR calc non Af Amer 48 (*)    GFR calc Af Amer 56 (*)    All other components within normal limits  CBC - Abnormal; Notable for the following components:   WBC 11.9 (*)    RBC 4.00 (*)    MCV 101.3 (*)    All other components within normal limits  BLOOD GAS, VENOUS - Abnormal; Notable for the following components:   Acid-base deficit 3.1 (*)    All other components within normal limits  I-STAT CG4 LACTIC ACID, ED - Abnormal; Notable for the following components:   Lactic Acid, Venous 3.60 (*)    All other components within normal limits  C DIFFICILE QUICK SCREEN W PCR REFLEX  CULTURE, BLOOD (ROUTINE X 2)  CULTURE, BLOOD (ROUTINE X 2)  GASTROINTESTINAL PANEL BY PCR, STOOL (REPLACES STOOL CULTURE)  LIPASE, BLOOD  URINALYSIS, ROUTINE W REFLEX MICROSCOPIC  MAGNESIUM  I-STAT TROPONIN, ED  I-STAT CG4 LACTIC ACID, ED  POC OCCULT BLOOD, ED    EKG EKG Interpretation  Date/Time:  Monday December 07 2018 17:10:29 EST Ventricular Rate:  86 PR Interval:    QRS Duration: 91 QT Interval:  384 QTC Calculation: 460 R Axis:   74 Text Interpretation:  Sinus rhythm Confirmed by Kristine Royal [phone]) on 12/07/2018 5:17:03 PM   Radiology No  results found.  Procedures Procedures (including critical care time) CRITICAL CARE Performed by: Wynetta Fines   Total critical care time: 30 minutes  Critical care time was exclusive of separately billable procedures and treating other patients.  Critical care was necessary to treat or prevent imminent or life-threatening deterioration.  Critical care was time spent personally by me on the following activities: development of treatment plan with patient and/or surrogate as well as nursing, discussions with consultants, evaluation of patient's response to treatment, examination of patient, obtaining history from patient or surrogate, ordering and performing treatments and interventions, ordering and review of laboratory studies, ordering and review of radiographic studies, pulse oximetry and re-evaluation of patient's condition.  Medications Ordered in ED Medications  0.9 %  sodium chloride infusion (has no administration in time range)  sodium chloride 0.9 %  bolus 1,000 mL (1,000 mLs Intravenous New Bag/Given 12/07/18 1719)  hydrocortisone sodium succinate (SOLU-CORTEF) 100 MG injection 100 mg (100 mg Intravenous Given 12/07/18 1803)  sodium chloride 0.9 % bolus 500 mL (500 mLs Intravenous New Bag/Given 12/07/18 1805)     Initial Impression / Assessment and Plan / ED Course  I have reviewed the triage vital signs and the nursing notes.  Pertinent labs & imaging results that were available during my care of the patient were reviewed by me and considered in my medical decision making (see chart for details).     MDM  Screen complete  Patient is presenting for evaluation of profound diarrhea over the last 36 hours.  Patient's presentation is concerning given likely dehydration.  Patient with mild hypotension and evidence of mild AKI.  Patient does appear to be fluid responsive.  Patient given stress dose steroids in the ED given prior history of adrenal insufficiency.  No  discrete bacterial infection identified on work-up.  Antibiotics held in the ED - Hospitalist service is aware of this fact.  Hospitalist service is aware of case and will evaluate for admission.    Final Clinical Impressions(s) / ED Diagnoses   Final diagnoses:  Diarrhea, unspecified type  Dehydration    ED Discharge Orders    None       Wynetta FinesMessick, Peter C, MD 12/07/18 [phone]

## 2018-12-07 NOTE — ED Notes (Signed)
Patient transported to X-ray 

## 2018-12-07 NOTE — ED Triage Notes (Signed)
Per EMS: Pt is coming from a group home for hypotension and diarrhea. Pt was given 3 imodium by staff at group home. Pt is baseline non-verbal and has Down Syndrome.  Pt's brother was reportedly contacted by the facility to come up here.

## 2018-12-08 ENCOUNTER — Other Ambulatory Visit: Payer: Self-pay

## 2018-12-08 DIAGNOSIS — R195 Other fecal abnormalities: Secondary | ICD-10-CM | POA: Diagnosis present

## 2018-12-08 DIAGNOSIS — R6510 Systemic inflammatory response syndrome (SIRS) of non-infectious origin without acute organ dysfunction: Secondary | ICD-10-CM

## 2018-12-08 LAB — GASTROINTESTINAL PANEL BY PCR, STOOL (REPLACES STOOL CULTURE)
Adenovirus F40/41: NOT DETECTED
Astrovirus: NOT DETECTED
Campylobacter species: NOT DETECTED
Cryptosporidium: NOT DETECTED
Cyclospora cayetanensis: NOT DETECTED
ENTAMOEBA HISTOLYTICA: NOT DETECTED
ENTEROPATHOGENIC E COLI (EPEC): NOT DETECTED
Enteroaggregative E coli (EAEC): NOT DETECTED
Enterotoxigenic E coli (ETEC): NOT DETECTED
Giardia lamblia: NOT DETECTED
Norovirus GI/GII: NOT DETECTED
Plesimonas shigelloides: NOT DETECTED
Rotavirus A: NOT DETECTED
Salmonella species: NOT DETECTED
Sapovirus (I, II, IV, and V): NOT DETECTED
Shiga like toxin producing E coli (STEC): NOT DETECTED
Shigella/Enteroinvasive E coli (EIEC): NOT DETECTED
Vibrio cholerae: NOT DETECTED
Vibrio species: NOT DETECTED
Yersinia enterocolitica: NOT DETECTED

## 2018-12-08 LAB — BASIC METABOLIC PANEL
Anion gap: 7 (ref 5–15)
Anion gap: 6 (ref 5–15)
BUN: 30 mg/dL — ABNORMAL HIGH (ref 8–23)
BUN: 27 mg/dL — ABNORMAL HIGH (ref 8–23)
CALCIUM: 7.6 mg/dL — AB (ref 8.9–10.3)
CO2: 21 mmol/L — AB (ref 22–32)
CO2: 19 mmol/L — ABNORMAL LOW (ref 22–32)
CREATININE: 1.23 mg/dL (ref 0.61–1.24)
CREATININE: 1.13 mg/dL (ref 0.61–1.24)
Calcium: 7.3 mg/dL — ABNORMAL LOW (ref 8.9–10.3)
Chloride: 112 mmol/L — ABNORMAL HIGH (ref 98–111)
Chloride: 114 mmol/L — ABNORMAL HIGH (ref 98–111)
GFR calc non Af Amer: >60 mL/min (ref >60)
GFR calc non Af Amer: >60 mL/min (ref >60)
Glucose, Bld: 113 mg/dL — ABNORMAL HIGH (ref 70–99)
Glucose, Bld: 102 mg/dL — ABNORMAL HIGH (ref 70–99)
Potassium: 5.1 mmol/L (ref 3.5–5.1)
Potassium: 4.6 mmol/L (ref 3.5–5.1)
Sodium: 140 mmol/L (ref 135–145)
Sodium: 139 mmol/L (ref 135–145)

## 2018-12-08 LAB — CBC
HEMATOCRIT: 34.9 % — AB (ref 39.0–52.0)
Hemoglobin: 11.3 g/dL — ABNORMAL LOW (ref 13.0–17.0)
MCH: 32.4 pg (ref 26.0–34.0)
MCHC: 32.4 g/dL (ref 30.0–36.0)
MCV: 100 fL (ref 80.0–100.0)
Platelets: 183 10*3/uL (ref 150–400)
RBC: 3.49 MIL/uL — ABNORMAL LOW (ref 4.22–5.81)
RDW: 14.3 % (ref 11.5–15.5)
WBC: 6.2 10*3/uL (ref 4.0–10.5)
nRBC: 0 % (ref 0.0–0.2)

## 2018-12-08 LAB — HIV ANTIBODY (ROUTINE TESTING W REFLEX): HIV Screen 4th Generation wRfx: NONREACTIVE

## 2018-12-08 LAB — LACTIC ACID, PLASMA: Lactic Acid, Venous: 1.3 mmol/L (ref 0.5–1.9)

## 2018-12-08 LAB — POC OCCULT BLOOD, ED: Fecal Occult Bld: POSITIVE — AB

## 2018-12-08 MED ORDER — PANTOPRAZOLE SODIUM 40 MG IV SOLR
40.0000 mg | Freq: Two times a day (BID) | INTRAVENOUS | Status: DC
  Administered 2018-12-08 – 2018-12-10 (×6): 40 mg via INTRAVENOUS
  Filled 2018-12-08 (×6): qty 40

## 2018-12-08 MED ORDER — SODIUM CHLORIDE 0.9 % IV BOLUS
1000.0000 mL | Freq: Once | INTRAVENOUS | Status: AC
  Administered 2018-12-08: 1000 mL via INTRAVENOUS

## 2018-12-08 MED ORDER — HYDROCORTISONE NA SUCCINATE PF 100 MG IJ SOLR
50.0000 mg | Freq: Four times a day (QID) | INTRAMUSCULAR | Status: AC
  Administered 2018-12-08 – 2018-12-09 (×4): 50 mg via INTRAVENOUS
  Filled 2018-12-08 (×4): qty 2

## 2018-12-08 NOTE — ED Notes (Signed)
Bed: [id number] Expected date:  Expected time:  Means of arrival:  Comments: Room 15

## 2018-12-08 NOTE — Progress Notes (Signed)
[facility] PROGRESS NOTE    Progress Note  Richard Greene  ZOX:[id number] DOB: Nov 09, 1956 DOA: 12/07/2018 PCP: Marletta Lor, NP     Brief Narrative:   Richard Greene is an 63 y.o. male past medical history significant of Down syndrome, mental retardation, gout chronic kidney disease stage II, adrenal insufficiency not on steroids stent from assisted living due to diarrhea and hypotension.  As per facility for the past 36 hours he has had over 10 bowel movements a day.  No complaints of abdominal pain no bloody stools.  In the ED was found with a heart rate of 30 satting 97% with a white count of 11.9, lactic acid of 3.6, and a rise in creatinine and a potassium of 5.4, UA negative CD PCR negative  Assessment/Plan:   SIRS (systemic inflammatory response syndrome) (HCC) Diarrhea PCR negative. He was fluid resuscitated with 3 mL/kg, lactic acidosis resolved. He was also started on stress dose steroids which will continue for 2 additional days then stop.  He is not on steroids at home. GI pathogen panel is pending. Continue to hold antibiotic he has remained afebrile  Gout Asymptomatic continue allopurinol.  Down syndrome Nonverbal at baseline.  Hyperkalemia: Likely due to hypovolemia resolved with IV fluid hydration.  Adrenal insufficiency (HCC) Cortisol not drawn in admission, currently on stress dose steroids continue 2 additional days and stop.  Acute renal failure superimposed on chronic kidney disease stage II: With a baseline creatinine around 1.0. On admission 1.5 likely prerenal azotemia, started on IV fluid hydration creatinine returned to baseline.  Occult blood positive stool Likely due to possible viral gastroenteritis.   DVT prophylaxis: lovenxo Family Communication:none Disposition Plan/Barrier to D/C: home once SIRs physiology resolved and off steroids Code Status:     Code Status Orders  (From admission, onward)         Start     Ordered   12/07/18 1931  Full code  Continuous     12/07/18 1931        Code Status History    This patient has a current code status but no historical code status.        IV Access:    Peripheral IV   Procedures and diagnostic studies:   Dg Abdomen Acute W/chest  Result Date: 12/07/2018 CLINICAL DATA:  Diarrhea. EXAM: DG ABDOMEN ACUTE W/ 1V CHEST COMPARISON:  Chest radiograph March 05, 2015 and abdominal radiograph July 30, 2013. FINDINGS: Cardiomediastinal silhouette is normal. Lungs are clear, no pleural effusions. No pneumothorax. Soft tissue planes and included osseous structures are unremarkable. Bowel gas pattern is nondilated and nonobstructive. Moderate volume retained large bowel stool. No intra-abdominal mass effect, or free air. Relatively stable at least 6 cm calcified mass projecting central pelvis. Soft tissue planes and included osseous structures are non-suspicious. IMPRESSION: 1. No acute cardiopulmonary process. 2. Moderate volume retained large bowel stool. Normal bowel gas pattern. 3. Similar calcification projecting in pelvis. Electronically Signed   By: Awilda Metro M.D.   On: 12/07/2018 19:15     Medical Consultants:    None.  Anti-Infectives:   None  Subjective:    Richard Greene nonverbal  Objective:    Vitals:   12/08/18 0630 12/08/18 0645 12/08/18 0700 12/08/18 0715  BP: (!) 103/56 (!) 103/58 94/66 112/65  Pulse: 64 64 73 63  Resp: 15 13 12 12   Temp:      TempSrc:      SpO2: 98% 99% 97% 100%  Intake/Output Summary (Last 24 hours) at 12/08/2018 0826 Last data filed at 12/08/2018 0251 Gross per 24 hour  Intake 3500 ml  Output 300 ml  Net 3200 ml   There were no vitals filed for this visit.  Exam: General exam: In no acute distress. Respiratory system: Good air movement and clear to auscultation. Cardiovascular system: S1 & S2 heard, RRR. No JVD, murmurs, rubs, gallops or clicks.  Gastrointestinal system: Abdomen is nondistended,  soft and nontender.  Central nervous system: Deferred patient not cooperating. Extremities: No pedal edema. Skin: No rashes, lesions or ulcers    Data Reviewed:    Labs: Basic Metabolic Panel: Recent Labs  Lab 12/07/18 1634 12/07/18 2107 12/08/18 0118 12/08/18 0537  NA 140  --  140 139  K 5.4*  --  5.1 4.6  CL 108  --  112* 114*  CO2 22  --  21* 19*  GLUCOSE 111*  --  113* 102*  BUN 34*  --  30* 27*  CREATININE 1.54*  --  1.23 1.13  CALCIUM 8.6*  --  7.6* 7.3*  MG  --  2.1  --   --    GFR CrCl cannot be calculated (Unknown ideal weight.). Liver Function Tests: Recent Labs  Lab 12/07/18 1634  AST 37  ALT 16  ALKPHOS 75  BILITOT 1.1  PROT 7.2  ALBUMIN 3.4*   Recent Labs  Lab 12/07/18 1634  LIPASE 27   No results for input(s): AMMONIA in the last 168 hours. Coagulation profile Recent Labs  Lab 12/07/18 2107  INR 1.00    CBC: Recent Labs  Lab 12/07/18 1634 12/08/18 0537  WBC 11.9* 6.2  HGB 13.4 11.3*  HCT 40.5 34.9*  MCV 101.3* 100.0  PLT 225 183   Cardiac Enzymes: No results for input(s): CKTOTAL, CKMB, CKMBINDEX, TROPONINI in the last 168 hours. BNP (last 3 results) No results for input(s): PROBNP in the last 8760 hours. CBG: No results for input(s): GLUCAP in the last 168 hours. D-Dimer: No results for input(s): DDIMER in the last 72 hours. Hgb A1c: No results for input(s): HGBA1C in the last 72 hours. Lipid Profile: No results for input(s): CHOL, HDL, LDLCALC, TRIG, CHOLHDL, LDLDIRECT in the last 72 hours. Thyroid function studies: No results for input(s): TSH, T4TOTAL, T3FREE, THYROIDAB in the last 72 hours.  Invalid input(s): FREET3 Anemia work up: No results for input(s): VITAMINB12, FOLATE, FERRITIN, TIBC, IRON, RETICCTPCT in the last 72 hours. Sepsis Labs: Recent Labs  Lab 12/07/18 1634 12/07/18 1742 12/07/18 1914 12/07/18 2107 12/08/18 0118 12/08/18 0537  PROCALCITON  --   --   --  0.10  --   --   WBC 11.9*  --   --    --   --  6.2  LATICACIDVEN  --  3.60* 1.69 1.3 1.3  --    Microbiology Recent Results (from the past 240 hour(s))  C difficile quick scan w PCR reflex     Status: None   Collection Time: 12/07/18  4:40 PM  Result Value Ref Range Status   C Diff antigen NEGATIVE NEGATIVE Final   C Diff toxin NEGATIVE NEGATIVE Final   C Diff interpretation No C. difficile detected.  Final    Comment: Performed at [facility], [address] [place], [place] [address]     Medications:   . allopurinol  100 mg Oral Daily  . hydrocortisone sod succinate (SOLU-CORTEF) inj  50 mg Intravenous Q6H  . pantoprazole  40  mg Intravenous Q12H  . risperiDONE  0.5 mg Oral BID   Continuous Infusions: . sodium chloride Stopped (12/08/18 0810)      LOS: 1 day   Marinda ElkAbraham Feliz Ortiz  [facility]   *Please refer to [email or web address], password [email or web address] to get updated schedule on who will round on this patient, as hospitalists switch teams weekly. If 7PM-7AM, please contact night-coverage at [email or web address], password [email or web address] for any overnight needs.  12/08/2018, 8:26 AM

## 2018-12-08 NOTE — ED Notes (Signed)
Patient had a positive Poc Occult Blood

## 2018-12-08 NOTE — ED Notes (Signed)
ED TO INPATIENT HANDOFF REPORT  Name/Age/Gender Richard Greene 62 y.o. male  Code Status    Code Status Orders  (From admission, onward)         Start     Ordered   12/07/18 1931  Full code  Continuous     12/07/18 1931        Code Status History    This patient has a current code status but no historical code status.      Home/SNF/Other Group Home  Chief Complaint diarrhea   Level of Care/Admitting Diagnosis ED Disposition    ED Disposition Condition Comment   Admit  Hospital Area: [facility] [[id number]]  Level of Care: Med-Surg [16]  Diagnosis: SIRS (systemic inflammatory response syndrome) (HCC) [161096]  Admitting Physician: Marinda Elk [[id number]]  Attending Physician: Marinda Elk [[id number]]  Estimated length of stay: past midnight tomorrow  Certification:: I certify this patient will need inpatient services for at least 2 midnights  PT Class (Do Not Modify): Inpatient [101]  PT Acc Code (Do Not Modify): Private [1]       Medical History Past Medical History:  Diagnosis Date  . Constipation   . Down syndrome   . Dyspepsia   . Gout, unspecified   . Hypokalemia 07/2013  . Obesity, unspecified   . Osteoarthrosis, unspecified whether generalized or localized, unspecified site   . Pure hypercholesterolemia     Allergies No Known Allergies  IV Location/Drains/Wounds Patient Lines/Drains/Airways Status   Active Line/Drains/Airways    Name:   Placement date:   Placement time:   Site:   Days:   Peripheral IV 12/07/18 Right Antecubital   12/07/18    1629    Antecubital   1   Pressure Ulcer 07/31/13 Stage II -  Partial thickness loss of dermis presenting as a shallow open ulcer with a red, pink wound bed without slough. Excoriation all over sacral, coccyx, R & L buttocks area from persistent diarrhea   07/31/13    0000     1956   Wound Excoriated (Scratch marks) Buttocks Right;Left reddened, excoriated skin d/t incontinence   -     -    Buttocks             Labs/Imaging Results for orders placed or performed during the hospital encounter of 12/07/18 (from the past 48 hour(s))  Lipase, blood     Status: None   Collection Time: 12/07/18  4:34 PM  Result Value Ref Range   Lipase 27 11 - 51 U/L    Comment: Performed at [facility], [address], [place], [place] [address]  Comprehensive metabolic panel     Status: Abnormal   Collection Time: 12/07/18  4:34 PM  Result Value Ref Range   Sodium 140 135 - 145 mmol/L   Potassium 5.4 (H) 3.5 - 5.1 mmol/L   Chloride 108 98 - 111 mmol/L   CO2 22 22 - 32 mmol/L   Glucose, Bld 111 (H) 70 - 99 mg/dL   BUN 34 (H) 8 - 23 mg/dL   Creatinine, Ser 9.81 (H) 0.61 - 1.24 mg/dL   Calcium 8.6 (L) 8.9 - 10.3 mg/dL   Total Protein 7.2 6.5 - 8.1 g/dL   Albumin 3.4 (L) 3.5 - 5.0 g/dL   AST 37 15 - 41 U/L   ALT 16 0 - 44 U/L   Alkaline Phosphatase 75 38 - 126 U/L   Total Bilirubin 1.1 0.3 - 1.2  mg/dL   GFR calc non Af Amer 48 (L) >60 mL/min   GFR calc Af Amer 56 (L) >60 mL/min   Anion gap 10 5 - 15    Comment: Performed at [facility], [address], [place], [place] [address]  CBC     Status: Abnormal   Collection Time: 12/07/18  4:34 PM  Result Value Ref Range   WBC 11.9 (H) 4.0 - 10.5 K/uL   RBC 4.00 (L) 4.22 - 5.81 MIL/uL   Hemoglobin 13.4 13.0 - 17.0 g/dL   HCT 60.4 54.0 - 98.1 %   MCV 101.3 (H) 80.0 - 100.0 fL   MCH 33.5 26.0 - 34.0 pg   MCHC 33.1 30.0 - 36.0 g/dL   RDW 19.1 47.8 - 29.5 %   Platelets 225 150 - 400 K/uL   nRBC 0.0 0.0 - 0.2 %    Comment: Performed at [facility], [address], [place], [place] [address]  Urinalysis, Routine w reflex microscopic     Status: None   Collection Time: 12/07/18  4:34 PM  Result Value Ref Range   Color, Urine YELLOW YELLOW   APPearance CLEAR CLEAR   Specific Gravity, Urine 1.020 1.005 - 1.030   pH 5.0 5.0 - 8.0   Glucose, UA NEGATIVE NEGATIVE mg/dL   Hgb  urine dipstick NEGATIVE NEGATIVE   Bilirubin Urine NEGATIVE NEGATIVE   Ketones, ur NEGATIVE NEGATIVE mg/dL   Protein, ur NEGATIVE NEGATIVE mg/dL   Nitrite NEGATIVE NEGATIVE   Leukocytes, UA NEGATIVE NEGATIVE    Comment: Performed at [facility], [address] [place], [place] [address]  C difficile quick scan w PCR reflex     Status: None   Collection Time: 12/07/18  4:40 PM  Result Value Ref Range   C Diff antigen NEGATIVE NEGATIVE   C Diff toxin NEGATIVE NEGATIVE   C Diff interpretation No C. difficile detected.     Comment: Performed at [facility], [address] [place], [place] [address]  Gastrointestinal Panel by PCR , Stool     Status: None   Collection Time: 12/07/18  4:40 PM  Result Value Ref Range   Campylobacter species NOT DETECTED NOT DETECTED   Plesimonas shigelloides NOT DETECTED NOT DETECTED   Salmonella species NOT DETECTED NOT DETECTED   Yersinia enterocolitica NOT DETECTED NOT DETECTED   Vibrio species NOT DETECTED NOT DETECTED   Vibrio cholerae NOT DETECTED NOT DETECTED   Enteroaggregative E coli (EAEC) NOT DETECTED NOT DETECTED   Enteropathogenic E coli (EPEC) NOT DETECTED NOT DETECTED   Enterotoxigenic E coli (ETEC) NOT DETECTED NOT DETECTED   Shiga like toxin producing E coli (STEC) NOT DETECTED NOT DETECTED   Shigella/Enteroinvasive E coli (EIEC) NOT DETECTED NOT DETECTED   Cryptosporidium NOT DETECTED NOT DETECTED   Cyclospora cayetanensis NOT DETECTED NOT DETECTED   Entamoeba histolytica NOT DETECTED NOT DETECTED   Giardia lamblia NOT DETECTED NOT DETECTED   Adenovirus F40/41 NOT DETECTED NOT DETECTED   Astrovirus NOT DETECTED NOT DETECTED   Norovirus GI/GII NOT DETECTED NOT DETECTED   Rotavirus A NOT DETECTED NOT DETECTED   Sapovirus (I, II, IV, and V) NOT DETECTED NOT DETECTED    Comment: Performed at [facility], [address] [place], [place] [address]  Blood gas, venous     Status: Abnormal    Collection Time: 12/07/18  5:32 PM  Result Value Ref Range   pH, Ven 7.276 7.250 - 7.430  pCO2, Ven 52.8 44.0 - 60.0 mmHg   pO2, Ven  BELOW REPORTABLE RANGE 32.0 - 45.0 mmHg    Comment:  RBV DR MESSICK,MD BY LISA CRADDOCK,RRT,RCP ON 12/07/18 AT 1746   Bicarbonate 23.8 20.0 - 28.0 mmol/L   Acid-base deficit 3.1 (H) 0.0 - 2.0 mmol/L   O2 Saturation 48.4 %   Patient temperature 98.6    Collection site VEIN    Drawn by DRAWN BY RN    Sample type VEIN     Comment: Performed at [facility], [address] [place], [place] [address]  I-stat troponin, ED     Status: None   Collection Time: 12/07/18  5:41 PM  Result Value Ref Range   Troponin i, poc 0.00 0.00 - 0.08 ng/mL   Comment 3            Comment: Due to the release kinetics of cTnI, a negative result within the first hours of the onset of symptoms does not rule out myocardial infarction with certainty. If myocardial infarction is still suspected, repeat the test at appropriate intervals.   I-Stat CG4 Lactic Acid, ED     Status: Abnormal   Collection Time: 12/07/18  5:42 PM  Result Value Ref Range   Lactic Acid, Venous 3.60 (HH) 0.5 - 1.9 mmol/L   Comment NOTIFIED PHYSICIAN   I-Stat CG4 Lactic Acid, ED     Status: None   Collection Time: 12/07/18  7:14 PM  Result Value Ref Range   Lactic Acid, Venous 1.69 0.5 - 1.9 mmol/L  Lactic acid, plasma     Status: None   Collection Time: 12/07/18  9:07 PM  Result Value Ref Range   Lactic Acid, Venous 1.3 0.5 - 1.9 mmol/L    Comment: Performed at [facility], [address] [place], [place] [address]  Procalcitonin     Status: None   Collection Time: 12/07/18  9:07 PM  Result Value Ref Range   Procalcitonin 0.10 ng/mL    Comment:        Interpretation: PCT (Procalcitonin) <= 0.5 ng/mL: Systemic infection (sepsis) is not likely. Local bacterial infection is possible. (NOTE)       Sepsis PCT Algorithm           Lower Respiratory Tract                                       Infection PCT Algorithm    ----------------------------     ----------------------------         PCT < 0.25 ng/mL                PCT < 0.10 ng/mL         Strongly encourage             Strongly discourage   discontinuation of antibiotics    initiation of antibiotics    ----------------------------     -----------------------------       PCT 0.25 - 0.50 ng/mL            PCT 0.10 - 0.25 ng/mL               OR       >80% decrease in PCT            Discourage initiation of  antibiotics      Encourage discontinuation           of antibiotics    ----------------------------     -----------------------------         PCT >= 0.50 ng/mL              PCT 0.26 - 0.50 ng/mL               AND        <80% decrease in PCT             Encourage initiation of                                             antibiotics       Encourage continuation           of antibiotics    ----------------------------     -----------------------------        PCT >= 0.50 ng/mL                  PCT > 0.50 ng/mL               AND         increase in PCT                  Strongly encourage                                      initiation of antibiotics    Strongly encourage escalation           of antibiotics                                     -----------------------------                                           PCT <= 0.25 ng/mL                                                 OR                                        > 80% decrease in PCT                                     Discontinue / Do not initiate                                             antibiotics Performed at [facility], [address], [place], [place] [address]   Protime-INR     Status: None   Collection Time: 12/07/18  9:07  PM  Result Value Ref Range   Prothrombin Time 13.1 11.4 - 15.2 seconds   INR 1.00     Comment: Performed at [facility], [address], [place], [place] [address]  Magnesium     Status: None   Collection Time: 12/07/18  9:07 PM  Result Value Ref Range   Magnesium 2.1 1.7 - 2.4 mg/dL    Comment: Performed at [facility], [address] [place], [place] [address]  Lactic acid, plasma     Status: None   Collection Time: 12/08/18  1:18 AM  Result Value Ref Range   Lactic Acid, Venous 1.3 0.5 - 1.9 mmol/L    Comment: Performed at [facility], [address], [place], [place] [address]  Basic metabolic panel     Status: Abnormal   Collection Time: 12/08/18  1:18 AM  Result Value Ref Range   Sodium 140 135 - 145 mmol/L   Potassium 5.1 3.5 - 5.1 mmol/L   Chloride 112 (H) 98 - 111 mmol/L   CO2 21 (L) 22 - 32 mmol/L   Glucose, Bld 113 (H) 70 - 99 mg/dL   BUN 30 (H) 8 - 23 mg/dL   Creatinine, Ser 9.14 0.61 - 1.24 mg/dL   Calcium 7.6 (L) 8.9 - 10.3 mg/dL   GFR calc non Af Amer >60 >60 mL/min   GFR calc Af Amer >60 >60 mL/min   Anion gap 7 5 - 15    Comment: Performed at [facility], [address], [place], [place] [address]  POC occult blood, ED     Status: Abnormal   Collection Time: 12/08/18  2:17 AM  Result Value Ref Range   Fecal Occult Bld POSITIVE (A) NEGATIVE  CBC     Status: Abnormal   Collection Time: 12/08/18  5:37 AM  Result Value Ref Range   WBC 6.2 4.0 - 10.5 K/uL   RBC 3.49 (L) 4.22 - 5.81 MIL/uL   Hemoglobin 11.3 (L) 13.0 - 17.0 g/dL   HCT 62.1 (L) 30.8 - 65.7 %   MCV 100.0 80.0 - 100.0 fL   MCH 32.4 26.0 - 34.0 pg   MCHC 32.4 30.0 - 36.0 g/dL   RDW 84.6 96.2 - 95.2 %   Platelets 183 150 - 400 K/uL   nRBC 0.0 0.0 - 0.2 %    Comment: Performed at [facility], [address] [place], [place] [address]  Basic metabolic panel     Status: Abnormal   Collection Time: 12/08/18  5:37 AM  Result Value Ref Range   Sodium 139 135 - 145 mmol/L   Potassium 4.6 3.5 - 5.1 mmol/L   Chloride 114 (H) 98 -  111 mmol/L   CO2 19 (L) 22 - 32 mmol/L   Glucose, Bld 102 (H) 70 - 99 mg/dL   BUN 27 (H) 8 - 23 mg/dL   Creatinine, Ser 4.40 0.61 - 1.24 mg/dL   Calcium 7.3 (L) 8.9 - 10.3 mg/dL   GFR calc non Af Amer >60 >60 mL/min   GFR calc Af Amer >60 >60 mL/min   Anion gap 6 5 - 15    Comment: Performed at [facility], [address] [place], [place] [address]   Dg Abdomen Acute W/chest  Result Date: 12/07/2018 CLINICAL DATA:  Diarrhea. EXAM: DG ABDOMEN ACUTE W/ 1V CHEST COMPARISON:  Chest radiograph March 05, 2015 and abdominal radiograph July 30, 2013. FINDINGS: Cardiomediastinal  silhouette is normal. Lungs are clear, no pleural effusions. No pneumothorax. Soft tissue planes and included osseous structures are unremarkable. Bowel gas pattern is nondilated and nonobstructive. Moderate volume retained large bowel stool. No intra-abdominal mass effect, or free air. Relatively stable at least 6 cm calcified mass projecting central pelvis. Soft tissue planes and included osseous structures are non-suspicious. IMPRESSION: 1. No acute cardiopulmonary process. 2. Moderate volume retained large bowel stool. Normal bowel gas pattern. 3. Similar calcification projecting in pelvis. Electronically Signed   By: Awilda Metro M.D.   On: 12/07/2018 19:15   EKG Interpretation  Date/Time:  Monday December 07 2018 17:10:29 EST Ventricular Rate:  86 PR Interval:    QRS Duration: 91 QT Interval:  384 QTC Calculation: 460 R Axis:   74 Text Interpretation:  Sinus rhythm Confirmed by Kristine Royal [phone]) on 12/07/2018 5:17:03 PM   Pending Labs Unresulted Labs (From admission, onward)    Start     Ordered   12/07/18 1929  Urine Culture  Add-on,   R    Comments:  Please get clean catch specimen ONLY (DO NOT obtain from urinal)    12/07/18 1928   12/07/18 1929  HIV antibody (Routine Testing)  Once,   R     12/07/18 1931   12/07/18 1639  Culture, blood (routine x 2)  BLOOD CULTURE X 2,   STAT      12/07/18 1638          Vitals/Pain Today's Vitals   12/08/18 1030 12/08/18 1045 12/08/18 1100 12/08/18 1135  BP: (!) 108/52 (!) 122/57 (!) 107/53 106/68  Pulse: 89 94 80 77  Resp: 17 20 20 12   Temp:      TempSrc:      SpO2: 100% 100% 100% 100%    Isolation Precautions Enteric precautions (UV disinfection)  Medications Medications  0.9 %  sodium chloride infusion ( Intravenous Stopped 12/08/18 0810)  allopurinol (ZYLOPRIM) tablet 100 mg (100 mg Oral Given 12/08/18 0940)  risperiDONE (RISPERDAL) tablet 0.5 mg (0.5 mg Oral Given 12/08/18 0940)  loperamide (IMODIUM) capsule 2 mg (2 mg Oral Given 12/08/18 0940)  acetaminophen (TYLENOL) tablet 650 mg (has no administration in time range)    Or  acetaminophen (TYLENOL) suppository 650 mg (has no administration in time range)  ondansetron (ZOFRAN) tablet 4 mg (has no administration in time range)    Or  ondansetron (ZOFRAN) injection 4 mg (has no administration in time range)  pantoprazole (PROTONIX) injection 40 mg (40 mg Intravenous Given 12/08/18 0945)  hydrocortisone sodium succinate (SOLU-CORTEF) 100 MG injection 50 mg (50 mg Intravenous Given 12/08/18 1112)  sodium chloride 0.9 % bolus 1,000 mL (0 mLs Intravenous Stopped 12/07/18 1954)  hydrocortisone sodium succinate (SOLU-CORTEF) 100 MG injection 100 mg (100 mg Intravenous Given 12/07/18 1803)  sodium chloride 0.9 % bolus 500 mL (0 mLs Intravenous Stopped 12/07/18 1954)  sodium chloride 0.9 % bolus 1,000 mL (0 mLs Intravenous Stopped 12/07/18 2216)  sodium chloride 0.9 % bolus 1,000 mL (0 mLs Intravenous Stopped 12/08/18 0251)    Mobility non-ambulatory

## 2018-12-08 NOTE — Progress Notes (Signed)
Report received. Pending transport.

## 2018-12-09 DIAGNOSIS — N182 Chronic kidney disease, stage 2 (mild): Secondary | ICD-10-CM

## 2018-12-09 DIAGNOSIS — R197 Diarrhea, unspecified: Secondary | ICD-10-CM

## 2018-12-09 DIAGNOSIS — Q909 Down syndrome, unspecified: Secondary | ICD-10-CM

## 2018-12-09 DIAGNOSIS — N179 Acute kidney failure, unspecified: Principal | ICD-10-CM

## 2018-12-09 LAB — URINE CULTURE: Culture: NO GROWTH

## 2018-12-09 NOTE — Progress Notes (Signed)
[facility] PROGRESS NOTE    Progress Note  Richard Greene  ZOX:[id number] DOB: Apr 20, 1956 DOA: 12/07/2018 PCP: Marletta Lor, NP     Brief Narrative:   Richard Greene is an 62 y.o. male past medical history significant of Down syndrome, mental retardation, gout chronic kidney disease stage II, adrenal insufficiency not on steroids sent from assisted living due to diarrhea and hypotension.  As per facility for the past 36 hours he has had over 10 bowel movements a day.  No complaints of abdominal pain, no bloody stools.  In the ED was found with a heart rate of 30 satting 97% with a white count of 11.9, lactic acid of 3.6, and a rise in creatinine and a potassium of 5.4, UA negative CD PCR negative  Assessment/Plan:   SIRS (systemic inflammatory response syndrome) (HCC) Diarrhea PCR negative. He was fluid resuscitated with 3 mL/kg, lactic acidosis resolved. He was also started on stress dose steroids which will continue for 2 additional days then stop.  He is not on steroids at home. GI pathogen panel is pending. Continue to hold antibiotic he has remained afebrile  12/09/18: -GI pathogen panel negative.  Stool more formed. -Off stress dose steroids.  Gout Asymptomatic continue allopurinol.  Down syndrome Nonverbal at baseline.  Hyperkalemia: Likely due to hypovolemia resolved with IV fluid hydration.  Adrenal insufficiency (HCC) Cortisol not drawn in admission, also stress dose steroids.  Acute renal failure superimposed on chronic kidney disease stage II: With a baseline creatinine around 1.0. On admission 1.5 likely prerenal azotemia, started on IV fluid hydration creatinine returned to baseline.  Occult blood positive stool Likely due to possible viral gastroenteritis. -Hemoglobin stable at this time.   DVT prophylaxis: lovenox Family Communication:none Disposition Plan/Barrier to D/C: home likely tomorrow once SIRs physiology resolved and off steroids Code  Status:     Code Status Orders  (From admission, onward)         Start     Ordered   12/07/18 1931  Full code  Continuous     12/07/18 1931        Code Status History    This patient has a current code status but no historical code status.        IV Access:    Peripheral IV   Procedures and diagnostic studies:   Dg Abdomen Acute W/chest  Result Date: 12/07/2018 CLINICAL DATA:  Diarrhea. EXAM: DG ABDOMEN ACUTE W/ 1V CHEST COMPARISON:  Chest radiograph March 05, 2015 and abdominal radiograph July 30, 2013. FINDINGS: Cardiomediastinal silhouette is normal. Lungs are clear, no pleural effusions. No pneumothorax. Soft tissue planes and included osseous structures are unremarkable. Bowel gas pattern is nondilated and nonobstructive. Moderate volume retained large bowel stool. No intra-abdominal mass effect, or free air. Relatively stable at least 6 cm calcified mass projecting central pelvis. Soft tissue planes and included osseous structures are non-suspicious. IMPRESSION: 1. No acute cardiopulmonary process. 2. Moderate volume retained large bowel stool. Normal bowel gas pattern. 3. Similar calcification projecting in pelvis. Electronically Signed   By: Awilda Metro M.D.   On: 12/07/2018 19:15     Medical Consultants:    None.  Anti-Infectives:   None  Subjective:    Richard Greene nonverbal  Objective:    Vitals:   12/08/18 1306 12/08/18 1311 12/08/18 2341 12/09/18 0423  BP: (!) 98/47  126/73 121/69  Pulse: 75  73 64  Resp: 16  16 16   Temp:    97.6 F (  36.4 C)  TempSrc:    Oral  SpO2: 100%  95% 100%  Weight:  68.7 kg      Intake/Output Summary (Last 24 hours) at 12/09/2018 1414 Last data filed at 12/09/2018 1349 Gross per 24 hour  Intake 2169.65 ml  Output -  Net 2169.65 ml   Filed Weights   12/08/18 1311  Weight: 68.7 kg    Exam: General exam: In no acute distress. Respiratory system: Good air movement and clear to  auscultation. Cardiovascular system: S1 & S2 heard, RRR. No JVD, murmurs, rubs, gallops or clicks.  Gastrointestinal system: Abdomen is nondistended, soft and nontender.  Central nervous system: Deferred patient not cooperating. Extremities: No pedal edema. Skin: No rashes, lesions or ulcers    Data Reviewed:    Labs: Basic Metabolic Panel: Recent Labs  Lab 12/07/18 1634 12/07/18 2107 12/08/18 0118 12/08/18 0537  NA 140  --  140 139  K 5.4*  --  5.1 4.6  CL 108  --  112* 114*  CO2 22  --  21* 19*  GLUCOSE 111*  --  113* 102*  BUN 34*  --  30* 27*  CREATININE 1.54*  --  1.23 1.13  CALCIUM 8.6*  --  7.6* 7.3*  MG  --  2.1  --   --    GFR CrCl cannot be calculated (Unknown ideal weight.). Liver Function Tests: Recent Labs  Lab 12/07/18 1634  AST 37  ALT 16  ALKPHOS 75  BILITOT 1.1  PROT 7.2  ALBUMIN 3.4*   Recent Labs  Lab 12/07/18 1634  LIPASE 27   No results for input(s): AMMONIA in the last 168 hours. Coagulation profile Recent Labs  Lab 12/07/18 2107  INR 1.00    CBC: Recent Labs  Lab 12/07/18 1634 12/08/18 0537  WBC 11.9* 6.2  HGB 13.4 11.3*  HCT 40.5 34.9*  MCV 101.3* 100.0  PLT 225 183   Cardiac Enzymes: No results for input(s): CKTOTAL, CKMB, CKMBINDEX, TROPONINI in the last 168 hours. BNP (last 3 results) No results for input(s): PROBNP in the last 8760 hours. CBG: No results for input(s): GLUCAP in the last 168 hours. D-Dimer: No results for input(s): DDIMER in the last 72 hours. Hgb A1c: No results for input(s): HGBA1C in the last 72 hours. Lipid Profile: No results for input(s): CHOL, HDL, LDLCALC, TRIG, CHOLHDL, LDLDIRECT in the last 72 hours. Thyroid function studies: No results for input(s): TSH, T4TOTAL, T3FREE, THYROIDAB in the last 72 hours.  Invalid input(s): FREET3 Anemia work up: No results for input(s): VITAMINB12, FOLATE, FERRITIN, TIBC, IRON, RETICCTPCT in the last 72 hours. Sepsis Labs: Recent Labs  Lab  12/07/18 1634 12/07/18 1742 12/07/18 1914 12/07/18 2107 12/08/18 0118 12/08/18 0537  PROCALCITON  --   --   --  0.10  --   --   WBC 11.9*  --   --   --   --  6.2  LATICACIDVEN  --  3.60* 1.69 1.3 1.3  --    Microbiology Recent Results (from the past 240 hour(s))  Urine Culture     Status: None   Collection Time: 12/07/18  4:34 PM  Result Value Ref Range Status   Specimen Description   Final    URINE, CLEAN CATCH Performed at [facility], [address] [place], [place] [address]    Special Requests   Final    NONE Performed at [facility], [address] [place], [place] [address]  Culture   Final    NO GROWTH Performed at [facility], [address], [place], [place] [address]    Report Status 12/09/2018 FINAL  Final  Culture, blood (routine x 2)     Status: None (Preliminary result)   Collection Time: 12/07/18  4:39 PM  Result Value Ref Range Status   Specimen Description   Final    LEFT ANTECUBITAL Performed at [facility] [address] [place], [place] [address]    Special Requests   Final    BOTTLES DRAWN AEROBIC AND ANAEROBIC Blood Culture adequate volume Performed at [facility], [address], [place], [place] [address]    Culture   Final    NO GROWTH 2 DAYS Performed at [facility], [address] [place], [place] [address]    Report Status PENDING  Incomplete  C difficile quick scan w PCR reflex     Status: None   Collection Time: 12/07/18  4:40 PM  Result Value Ref Range Status   C Diff antigen NEGATIVE NEGATIVE Final   C Diff toxin NEGATIVE NEGATIVE Final   C Diff interpretation No C. difficile detected.  Final    Comment: Performed at [facility], [address] [place], [place] [address]  Gastrointestinal Panel by PCR , Stool     Status: None   Collection Time: 12/07/18  4:40 PM  Result Value Ref Range Status   Campylobacter  species NOT DETECTED NOT DETECTED Final   Plesimonas shigelloides NOT DETECTED NOT DETECTED Final   Salmonella species NOT DETECTED NOT DETECTED Final   Yersinia enterocolitica NOT DETECTED NOT DETECTED Final   Vibrio species NOT DETECTED NOT DETECTED Final   Vibrio cholerae NOT DETECTED NOT DETECTED Final   Enteroaggregative E coli (EAEC) NOT DETECTED NOT DETECTED Final   Enteropathogenic E coli (EPEC) NOT DETECTED NOT DETECTED Final   Enterotoxigenic E coli (ETEC) NOT DETECTED NOT DETECTED Final   Shiga like toxin producing E coli (STEC) NOT DETECTED NOT DETECTED Final   Shigella/Enteroinvasive E coli (EIEC) NOT DETECTED NOT DETECTED Final   Cryptosporidium NOT DETECTED NOT DETECTED Final   Cyclospora cayetanensis NOT DETECTED NOT DETECTED Final   Entamoeba histolytica NOT DETECTED NOT DETECTED Final   Giardia lamblia NOT DETECTED NOT DETECTED Final   Adenovirus F40/41 NOT DETECTED NOT DETECTED Final   Astrovirus NOT DETECTED NOT DETECTED Final   Norovirus GI/GII NOT DETECTED NOT DETECTED Final   Rotavirus A NOT DETECTED NOT DETECTED Final   Sapovirus (I, II, IV, and V) NOT DETECTED NOT DETECTED Final    Comment: Performed at [facility], [address] [place], [place] [address]  Culture, blood (routine x 2)     Status: None (Preliminary result)   Collection Time: 12/07/18  4:44 PM  Result Value Ref Range Status   Specimen Description   Final    BLOOD LEFT FOREARM Performed at [facility], [address], [place], [place] [address]    Special Requests   Final    BOTTLES DRAWN AEROBIC AND ANAEROBIC Blood Culture adequate volume Performed at [facility], [address] [place], [place] [address]    Culture   Final    NO GROWTH 2 DAYS Performed at [facility], [address], [place], [place] [address]    Report Status PENDING  Incomplete     Medications:   . allopurinol  100 mg Oral Daily  .  pantoprazole  40 mg  Intravenous Q12H  . risperiDONE  0.5 mg Oral BID   Continuous Infusions: . sodium chloride Stopped (12/08/18 0810)      LOS: 2 days   Vonzella Nipple, MD  [facility]   *Please refer to [email or web address], password [email or web address] to get updated schedule on who will round on this patient, as hospitalists switch teams weekly. If 7PM-7AM, please contact night-coverage at [email or web address], password [email or web address] for any overnight needs.  12/09/2018, 2:14 PM

## 2018-12-10 LAB — CBC
HEMATOCRIT: 33.9 % — AB (ref 39.0–52.0)
Hemoglobin: 11.5 g/dL — ABNORMAL LOW (ref 13.0–17.0)
MCH: 33.3 pg (ref 26.0–34.0)
MCHC: 33.9 g/dL (ref 30.0–36.0)
MCV: 98.3 fL (ref 80.0–100.0)
NRBC: 0 % (ref 0.0–0.2)
Platelets: 167 10*3/uL (ref 150–400)
RBC: 3.45 MIL/uL — ABNORMAL LOW (ref 4.22–5.81)
RDW: 13.6 % (ref 11.5–15.5)
WBC: 8.3 10*3/uL (ref 4.0–10.5)

## 2018-12-10 LAB — BASIC METABOLIC PANEL
Anion gap: 8 (ref 5–15)
BUN: 22 mg/dL (ref 8–23)
CO2: 21 mmol/L — AB (ref 22–32)
Calcium: 8.1 mg/dL — ABNORMAL LOW (ref 8.9–10.3)
Chloride: 111 mmol/L (ref 98–111)
Creatinine, Ser: 1.24 mg/dL (ref 0.61–1.24)
GFR calc Af Amer: >60 mL/min (ref >60)
GFR calc non Af Amer: >60 mL/min (ref >60)
Glucose, Bld: 81 mg/dL (ref 70–99)
Potassium: 3.4 mmol/L — ABNORMAL LOW (ref 3.5–5.1)
Sodium: 140 mmol/L (ref 135–145)

## 2018-12-10 MED ORDER — POTASSIUM CHLORIDE CRYS ER 20 MEQ PO TBCR
40.0000 meq | EXTENDED_RELEASE_TABLET | Freq: Once | ORAL | Status: AC
  Administered 2018-12-10: 40 meq via ORAL
  Filled 2018-12-10: qty 2

## 2018-12-10 NOTE — Discharge Summary (Addendum)
Physician Discharge Summary  KEVEN OSBORN WUJ:[id number] DOB: [birth year] DOA: 12/07/2018  PCP: Marletta Lor, NP  Admit date: 12/07/2018 Discharge date: 12/10/2018  Admitted From: Group home Disposition: Patient being discharged to the group home  Recommendations for Outpatient Follow-up:  1. Follow up with PCP in 1-2 weeks 2. Please obtain BMP/CBC in one week 3. Please follow up on the following pending results:  Home Health: No Equipment/Devices: No  Discharge Condition:Stable CODE STATUS: full Diet recommendation: Regular  Brief/Interim Summary: CALLAHAN WILD is an 62 y.o. male past medical history significant of Down syndrome, mental retardation, gout chronic kidney disease stage II, adrenal insufficiency not on steroids sent from group home due to diarrhea and hypotension.  As per facility for the past 36 hours he has had over 10 bowel movements a day.  No complaints of abdominal pain, no bloody stools.  In the ED was found with a heart rate of 30 satting 97% with a white count of 11.9, lactic acid of 3.6, and a rise in creatinine and a potassium of 5.4, UA negative CD PCR negative.  Following is the hospital course:  SIRS (systemic inflammatory response syndrome) (HCC) Diarrhea PCR negative. He was fluid resuscitated with 3 mL/kg, lactic acidosis resolved. He was also started on stress dose steroids initially and then stopped.  He is not on steroids at home. GI pathogen panel negative. Stool more formed. -Blood pressure stable off stress dose steroids.  Gout Asymptomatic continue allopurinol.  Down syndrome Nonverbal at baseline.  Hyperkalemia: Likely due to hypovolemia resolved with IV fluid hydration.  Acute renal failure superimposed on chronic kidney disease stage II: With a baseline creatinine around 1.0. On admission 1.5 likely prerenal azotemia, started on IV fluid hydration creatinine returned to baseline.  Occult blood positive stool Likely due to  possible viral gastroenteritis. -Hemoglobin stable at this time.  Discharge Diagnoses:  Principal Problem:   Diarrhea Active Problems:   Gout   Down syndrome   Adrenal insufficiency (HCC)   Sepsis (HCC)   Hyperkalemia   Acute renal failure superimposed on stage 2 chronic kidney disease (HCC)   Occult blood positive stool   SIRS (systemic inflammatory response syndrome) (HCC)    Discharge Instructions Patient at this time is stable and back to his baseline.  His symptoms have resolved.  His vitals and physical examination is stable as well.  He is being discharged back to the group home.   Allergies as of 12/10/2018   No Known Allergies     Medication List    TAKE these medications   allopurinol 100 MG tablet Commonly known as:  ZYLOPRIM Take 100 mg by mouth daily.   loperamide 2 MG capsule Commonly known as:  IMODIUM Take 1 capsule (2 mg total) by mouth 4 (four) times daily as needed for diarrhea or loose stools.   risperiDONE 0.5 MG tablet Commonly known as:  RISPERDAL Take 0.5 mg by mouth 2 (two) times daily.       No Known Allergies  Consultations:  None   Procedures/Studies: Dg Abdomen Acute W/chest  Result Date: 12/07/2018 CLINICAL DATA:  Diarrhea. EXAM: DG ABDOMEN ACUTE W/ 1V CHEST COMPARISON:  Chest radiograph March 05, 2015 and abdominal radiograph July 30, 2013. FINDINGS: Cardiomediastinal silhouette is normal. Lungs are clear, no pleural effusions. No pneumothorax. Soft tissue planes and included osseous structures are unremarkable. Bowel gas pattern is nondilated and nonobstructive. Moderate volume retained large bowel stool. No intra-abdominal mass effect, or free air. Relatively stable at  least 6 cm calcified mass projecting central pelvis. Soft tissue planes and included osseous structures are non-suspicious. IMPRESSION: 1. No acute cardiopulmonary process. 2. Moderate volume retained large bowel stool. Normal bowel gas pattern. 3. Similar  calcification projecting in pelvis. Electronically Signed   By: Awilda Metro M.D.   On: 12/07/2018 19:15       Subjective: Patient nonverbal at baseline.  He has Down syndrome.  His symptoms have resolved at this time and he is back to his baseline.  Discharge Exam: Vitals:   12/09/18 2227 12/10/18 0540  BP: 104/76 103/63  Pulse: 68 70  Resp: 17 19  Temp: 98 F (36.7 C) 98 F (36.7 C)  SpO2: 97% 100%   Vitals:   12/09/18 0423 12/09/18 1456 12/09/18 2227 12/10/18 0540  BP: 121/69 109/67 104/76 103/63  Pulse: 64 100 68 70  Resp: 16 14 17 19   Temp: 97.6 F (36.4 C) 98.7 F (37.1 C) 98 F (36.7 C) 98 F (36.7 C)  TempSrc: Oral Oral Oral Oral  SpO2: 100% 100% 97% 100%  Weight:        The results of significant diagnostics from this hospitalization (including imaging, microbiology, ancillary and laboratory) are listed below for reference.     Microbiology: Recent Results (from the past 240 hour(s))  Urine Culture     Status: None   Collection Time: 12/07/18  4:34 PM  Result Value Ref Range Status   Specimen Description   Final    URINE, CLEAN CATCH Performed at [facility], [address], [place], [place] [address]    Special Requests   Final    NONE Performed at [facility], [address] [place], [place] [address]    Culture   Final    NO GROWTH Performed at [facility], [address], [place], [place] [address]    Report Status 12/09/2018 FINAL  Final  Culture, blood (routine x 2)     Status: None (Preliminary result)   Collection Time: 12/07/18  4:39 PM  Result Value Ref Range Status   Specimen Description   Final    LEFT ANTECUBITAL Performed at [facility], [address], [place], [place] [address]    Special Requests   Final    BOTTLES DRAWN AEROBIC AND ANAEROBIC Blood Culture adequate volume Performed at [facility], [address] [place], [place]  [address]    Culture   Final    NO GROWTH 2 DAYS Performed at [facility], [address] [place], [place] [address]    Report Status PENDING  Incomplete  C difficile quick scan w PCR reflex     Status: None   Collection Time: 12/07/18  4:40 PM  Result Value Ref Range Status   C Diff antigen NEGATIVE NEGATIVE Final   C Diff toxin NEGATIVE NEGATIVE Final   C Diff interpretation No C. difficile detected.  Final    Comment: Performed at [facility], [address], [place], [place] [address]  Gastrointestinal Panel by PCR , Stool     Status: None   Collection Time: 12/07/18  4:40 PM  Result Value Ref Range Status   Campylobacter species NOT DETECTED NOT DETECTED Final   Plesimonas shigelloides NOT DETECTED NOT DETECTED Final   Salmonella species NOT DETECTED NOT DETECTED Final   Yersinia enterocolitica NOT DETECTED NOT DETECTED Final   Vibrio species NOT DETECTED NOT DETECTED Final   Vibrio  cholerae NOT DETECTED NOT DETECTED Final   Enteroaggregative E coli (EAEC) NOT DETECTED NOT DETECTED Final   Enteropathogenic E coli (EPEC) NOT DETECTED NOT DETECTED Final   Enterotoxigenic E coli (ETEC) NOT DETECTED NOT DETECTED Final   Shiga like toxin producing E coli (STEC) NOT DETECTED NOT DETECTED Final   Shigella/Enteroinvasive E coli (EIEC) NOT DETECTED NOT DETECTED Final   Cryptosporidium NOT DETECTED NOT DETECTED Final   Cyclospora cayetanensis NOT DETECTED NOT DETECTED Final   Entamoeba histolytica NOT DETECTED NOT DETECTED Final   Giardia lamblia NOT DETECTED NOT DETECTED Final   Adenovirus F40/41 NOT DETECTED NOT DETECTED Final   Astrovirus NOT DETECTED NOT DETECTED Final   Norovirus GI/GII NOT DETECTED NOT DETECTED Final   Rotavirus A NOT DETECTED NOT DETECTED Final   Sapovirus (I, II, IV, and V) NOT DETECTED NOT DETECTED Final    Comment: Performed at [facility], [address] [place], [place] [address]  Culture, blood (routine x 2)      Status: None (Preliminary result)   Collection Time: 12/07/18  4:44 PM  Result Value Ref Range Status   Specimen Description   Final    BLOOD LEFT FOREARM Performed at [facility], [address] [place], [place] [address]    Special Requests   Final    BOTTLES DRAWN AEROBIC AND ANAEROBIC Blood Culture adequate volume Performed at [facility], [address], [place], [place] [address]    Culture   Final    NO GROWTH 2 DAYS Performed at [facility], [address], [place], [place] [address]    Report Status PENDING  Incomplete     Labs: BNP (last 3 results) No results for input(s): BNP in the last 8760 hours. Basic Metabolic Panel: Recent Labs  Lab 12/07/18 1634 12/07/18 2107 12/08/18 0118 12/08/18 0537 12/10/18 0627  NA 140  --  140 139 140  K 5.4*  --  5.1 4.6 3.4*  CL 108  --  112* 114* 111  CO2 22  --  21* 19* 21*  GLUCOSE 111*  --  113* 102* 81  BUN 34*  --  30* 27* 22  CREATININE 1.54*  --  1.23 1.13 1.24  CALCIUM 8.6*  --  7.6* 7.3* 8.1*  MG  --  2.1  --   --   --    Liver Function Tests: Recent Labs  Lab 12/07/18 1634  AST 37  ALT 16  ALKPHOS 75  BILITOT 1.1  PROT 7.2  ALBUMIN 3.4*   Recent Labs  Lab 12/07/18 1634  LIPASE 27   No results for input(s): AMMONIA in the last 168 hours. CBC: Recent Labs  Lab 12/07/18 1634 12/08/18 0537 12/10/18 0627  WBC 11.9* 6.2 8.3  HGB 13.4 11.3* 11.5*  HCT 40.5 34.9* 33.9*  MCV 101.3* 100.0 98.3  PLT 225 183 167   Cardiac Enzymes: No results for input(s): CKTOTAL, CKMB, CKMBINDEX, TROPONINI in the last 168 hours. BNP: Invalid input(s): POCBNP CBG: No results for input(s): GLUCAP in the last 168 hours. D-Dimer No results for input(s): DDIMER in the last 72 hours. Hgb A1c No results for input(s): HGBA1C in the last 72 hours. Lipid Profile No results for input(s): CHOL, HDL, LDLCALC, TRIG, CHOLHDL, LDLDIRECT in the last 72 hours. Thyroid function  studies No results for input(s): TSH, T4TOTAL, T3FREE, THYROIDAB in the last 72 hours.  Invalid input(s): FREET3 Anemia work up No results for input(s): VITAMINB12, FOLATE, FERRITIN,  TIBC, IRON, RETICCTPCT in the last 72 hours. Urinalysis    Component Value Date/Time   COLORURINE YELLOW 12/07/2018 1634   APPEARANCEUR CLEAR 12/07/2018 1634   LABSPEC 1.020 12/07/2018 1634   PHURINE 5.0 12/07/2018 1634   GLUCOSEU NEGATIVE 12/07/2018 1634   HGBUR NEGATIVE 12/07/2018 1634   BILIRUBINUR NEGATIVE 12/07/2018 1634   KETONESUR NEGATIVE 12/07/2018 1634   PROTEINUR NEGATIVE 12/07/2018 1634   UROBILINOGEN 0.2 07/30/2013 1352   NITRITE NEGATIVE 12/07/2018 1634   LEUKOCYTESUR NEGATIVE 12/07/2018 1634   Sepsis Labs Invalid input(s): PROCALCITONIN,  WBC,  LACTICIDVEN Microbiology Recent Results (from the past 240 hour(s))  Urine Culture     Status: None   Collection Time: 12/07/18  4:34 PM  Result Value Ref Range Status   Specimen Description   Final    URINE, CLEAN CATCH Performed at [facility], [address] [place], [place] [address]    Special Requests   Final    NONE Performed at [facility], [address] [place], [place] [address]    Culture   Final    NO GROWTH Performed at [facility], [address] [place], [place] [address]    Report Status 12/09/2018 FINAL  Final  Culture, blood (routine x 2)     Status: None (Preliminary result)   Collection Time: 12/07/18  4:39 PM  Result Value Ref Range Status   Specimen Description   Final    LEFT ANTECUBITAL Performed at [facility], [address] [place], [place] [address]    Special Requests   Final    BOTTLES DRAWN AEROBIC AND ANAEROBIC Blood Culture adequate volume Performed at [facility], [address] [place], [place] [address]    Culture   Final    NO GROWTH 2 DAYS Performed at [facility], [address]  [place], [place] [address]    Report Status PENDING  Incomplete  C difficile quick scan w PCR reflex     Status: None   Collection Time: 12/07/18  4:40 PM  Result Value Ref Range Status   C Diff antigen NEGATIVE NEGATIVE Final   C Diff toxin NEGATIVE NEGATIVE Final   C Diff interpretation No C. difficile detected.  Final    Comment: Performed at [facility], [address] [place], [place] [address]  Gastrointestinal Panel by PCR , Stool     Status: None   Collection Time: 12/07/18  4:40 PM  Result Value Ref Range Status   Campylobacter species NOT DETECTED NOT DETECTED Final   Plesimonas shigelloides NOT DETECTED NOT DETECTED Final   Salmonella species NOT DETECTED NOT DETECTED Final   Yersinia enterocolitica NOT DETECTED NOT DETECTED Final   Vibrio species NOT DETECTED NOT DETECTED Final   Vibrio cholerae NOT DETECTED NOT DETECTED Final   Enteroaggregative E coli (EAEC) NOT DETECTED NOT DETECTED Final   Enteropathogenic E coli (EPEC) NOT DETECTED NOT DETECTED Final   Enterotoxigenic E coli (ETEC) NOT DETECTED NOT DETECTED Final   Shiga like toxin producing E coli (STEC) NOT DETECTED NOT DETECTED Final   Shigella/Enteroinvasive E coli (EIEC) NOT DETECTED NOT DETECTED Final   Cryptosporidium NOT DETECTED NOT DETECTED Final   Cyclospora cayetanensis NOT DETECTED NOT DETECTED Final   Entamoeba histolytica NOT DETECTED NOT DETECTED Final   Giardia lamblia NOT DETECTED NOT DETECTED Final   Adenovirus F40/41 NOT DETECTED NOT DETECTED Final   Astrovirus NOT DETECTED NOT DETECTED Final  Norovirus GI/GII NOT DETECTED NOT DETECTED Final   Rotavirus A NOT DETECTED NOT DETECTED Final   Sapovirus (I, II, IV, and V) NOT DETECTED NOT DETECTED Final    Comment: Performed at [facility], [address] [place], [place] [address]  Culture, blood (routine x 2)     Status: None (Preliminary result)   Collection Time: 12/07/18  4:44 PM  Result Value Ref Range Status    Specimen Description   Final    BLOOD LEFT FOREARM Performed at [facility], [address], [place], [place] [address]    Special Requests   Final    BOTTLES DRAWN AEROBIC AND ANAEROBIC Blood Culture adequate volume Performed at [facility], [address] [place], [place] [address]    Culture   Final    NO GROWTH 2 DAYS Performed at [facility], [address] [place], [place] [address]    Report Status PENDING  Incomplete     Time coordinating discharge: Over 30 minutes  SIGNED:   Darrold Junker, MD  [facility] 12/10/2018, 10:36 AM Pager on amion  If 7PM-7AM, please contact night-coverage [email or web address] Password [email or web address]

## 2018-12-10 NOTE — Care Management Note (Signed)
Case Management Note  Patient Details  Name: Tessie FassShawn A Boys MRN: [id number] Date of Birth: 06-Apr-1956  Subjective/Objective:                  discharged  Action/Plan: Discharged to home with self-care, orders checked for hhc needs. No CM needs present at time of discharge.  Patient is able to arrangement own appointments and home care.  Expected Discharge Date:  12/10/18               Expected Discharge Plan:  Home/Self Care  In-House Referral:     Discharge planning Services  CM Consult  Post Acute Care Choice:    Choice offered to:     DME Arranged:    DME Agency:     HH Arranged:    HH Agency:     Status of Service:  Completed, signed off  If discussed at [facility] Length of Stay Meetings, dates discussed:    Additional Comments:  Golda AcreDavis, Rhonda Lynn, RN 12/10/2018, 11:12 AM

## 2018-12-10 NOTE — Progress Notes (Signed)
LCSW following for dc planning.   Patient will return to group home. LCSW confirmed return with Bridgette.   LCSW will fax dc docs. No need for RN to call report per Bridgette.   RN: Please call Bridgette at [phone] when [facility] picks up patient.    Beulah GandyBernette Myriah Boggus, LSCW [facility] [phone]

## 2018-12-10 NOTE — Care Management Important Message (Signed)
Important Message  Patient Details  Name: Richard Greene MRN: [id number] Date of Birth: [birth year]   Medicare Important Message Given:  Yes    Caren MacadamFuller, Zaylei Mullane 12/10/2018, 12:35 PMImportant Message  Patient Details  Name: Richard Greene MRN: [id number] Date of Birth: [birth year]   Medicare Important Message Given:  Yes    Caren MacadamFuller, Solly Derasmo 12/10/2018, 12:35 PM

## 2018-12-12 LAB — CULTURE, BLOOD (ROUTINE X 2)
Culture: NO GROWTH
Culture: NO GROWTH
SPECIAL REQUESTS: ADEQUATE
Special Requests: ADEQUATE

## 2020-01-01 ENCOUNTER — Emergency Department (HOSPITAL_COMMUNITY): Payer: Medicare Other

## 2020-01-01 ENCOUNTER — Encounter (HOSPITAL_COMMUNITY): Payer: Self-pay | Admitting: Emergency Medicine

## 2020-01-01 ENCOUNTER — Inpatient Hospital Stay (HOSPITAL_COMMUNITY)
Admission: EM | Admit: 2020-01-01 | Discharge: 2020-01-31 | DRG: 871 | Disposition: E | Payer: Medicare Other | Attending: Pulmonary Disease | Admitting: Pulmonary Disease

## 2020-01-01 ENCOUNTER — Other Ambulatory Visit: Payer: Self-pay

## 2020-01-01 DIAGNOSIS — A419 Sepsis, unspecified organism: Secondary | ICD-10-CM | POA: Diagnosis present

## 2020-01-01 DIAGNOSIS — N179 Acute kidney failure, unspecified: Secondary | ICD-10-CM | POA: Diagnosis present

## 2020-01-01 DIAGNOSIS — E162 Hypoglycemia, unspecified: Secondary | ICD-10-CM | POA: Diagnosis present

## 2020-01-01 DIAGNOSIS — R6521 Severe sepsis with septic shock: Secondary | ICD-10-CM | POA: Diagnosis present

## 2020-01-01 DIAGNOSIS — D696 Thrombocytopenia, unspecified: Secondary | ICD-10-CM | POA: Diagnosis present

## 2020-01-01 DIAGNOSIS — K529 Noninfective gastroenteritis and colitis, unspecified: Secondary | ICD-10-CM | POA: Diagnosis present

## 2020-01-01 DIAGNOSIS — Z66 Do not resuscitate: Secondary | ICD-10-CM | POA: Diagnosis not present

## 2020-01-01 DIAGNOSIS — M109 Gout, unspecified: Secondary | ICD-10-CM | POA: Diagnosis present

## 2020-01-01 DIAGNOSIS — Z515 Encounter for palliative care: Secondary | ICD-10-CM | POA: Diagnosis present

## 2020-01-01 DIAGNOSIS — B179 Acute viral hepatitis, unspecified: Secondary | ICD-10-CM | POA: Diagnosis present

## 2020-01-01 DIAGNOSIS — Z79899 Other long term (current) drug therapy: Secondary | ICD-10-CM

## 2020-01-01 DIAGNOSIS — T68XXXA Hypothermia, initial encounter: Secondary | ICD-10-CM | POA: Diagnosis present

## 2020-01-01 DIAGNOSIS — E876 Hypokalemia: Secondary | ICD-10-CM | POA: Diagnosis present

## 2020-01-01 DIAGNOSIS — E874 Mixed disorder of acid-base balance: Secondary | ICD-10-CM | POA: Diagnosis present

## 2020-01-01 DIAGNOSIS — E639 Nutritional deficiency, unspecified: Secondary | ICD-10-CM | POA: Diagnosis not present

## 2020-01-01 DIAGNOSIS — Q909 Down syndrome, unspecified: Secondary | ICD-10-CM | POA: Diagnosis not present

## 2020-01-01 DIAGNOSIS — F79 Unspecified intellectual disabilities: Secondary | ICD-10-CM | POA: Diagnosis present

## 2020-01-01 DIAGNOSIS — J189 Pneumonia, unspecified organism: Secondary | ICD-10-CM | POA: Diagnosis present

## 2020-01-01 DIAGNOSIS — Z9181 History of falling: Secondary | ICD-10-CM

## 2020-01-01 DIAGNOSIS — E875 Hyperkalemia: Secondary | ICD-10-CM | POA: Diagnosis present

## 2020-01-01 DIAGNOSIS — J9601 Acute respiratory failure with hypoxia: Secondary | ICD-10-CM | POA: Diagnosis present

## 2020-01-01 DIAGNOSIS — W07XXXS Fall from chair, sequela: Secondary | ICD-10-CM

## 2020-01-01 DIAGNOSIS — J811 Chronic pulmonary edema: Secondary | ICD-10-CM | POA: Diagnosis present

## 2020-01-01 DIAGNOSIS — A4150 Gram-negative sepsis, unspecified: Principal | ICD-10-CM | POA: Diagnosis present

## 2020-01-01 DIAGNOSIS — K5900 Constipation, unspecified: Secondary | ICD-10-CM | POA: Diagnosis present

## 2020-01-01 DIAGNOSIS — I361 Nonrheumatic tricuspid (valve) insufficiency: Secondary | ICD-10-CM | POA: Diagnosis not present

## 2020-01-01 DIAGNOSIS — K828 Other specified diseases of gallbladder: Secondary | ICD-10-CM

## 2020-01-01 DIAGNOSIS — Z20822 Contact with and (suspected) exposure to covid-19: Secondary | ICD-10-CM | POA: Diagnosis present

## 2020-01-01 DIAGNOSIS — E2740 Unspecified adrenocortical insufficiency: Secondary | ICD-10-CM | POA: Diagnosis present

## 2020-01-01 DIAGNOSIS — W07XXXA Fall from chair, initial encounter: Secondary | ICD-10-CM

## 2020-01-01 DIAGNOSIS — E869 Volume depletion, unspecified: Secondary | ICD-10-CM | POA: Diagnosis present

## 2020-01-01 DIAGNOSIS — Z7189 Other specified counseling: Secondary | ICD-10-CM | POA: Diagnosis not present

## 2020-01-01 DIAGNOSIS — I340 Nonrheumatic mitral (valve) insufficiency: Secondary | ICD-10-CM | POA: Diagnosis not present

## 2020-01-01 LAB — CBC WITH DIFFERENTIAL/PLATELET
Abs Immature Granulocytes: 0.01 10*3/uL (ref 0.00–0.07)
Basophils Absolute: 0 10*3/uL (ref 0.0–0.1)
Basophils Relative: 0 %
Eosinophils Absolute: 0 10*3/uL (ref 0.0–0.5)
Eosinophils Relative: 0 %
HCT: 31.7 % — ABNORMAL LOW (ref 39.0–52.0)
Hemoglobin: 10.6 g/dL — ABNORMAL LOW (ref 13.0–17.0)
Immature Granulocytes: 0 %
Lymphocytes Relative: 17 %
Lymphs Abs: 0.6 10*3/uL — ABNORMAL LOW (ref 0.7–4.0)
MCH: 33 pg (ref 26.0–34.0)
MCHC: 33.4 g/dL (ref 30.0–36.0)
MCV: 98.8 fL (ref 80.0–100.0)
Monocytes Absolute: 0.6 10*3/uL (ref 0.1–1.0)
Monocytes Relative: 15 %
Neutro Abs: 2.6 10*3/uL (ref 1.7–7.7)
Neutrophils Relative %: 68 %
Platelets: 58 10*3/uL — ABNORMAL LOW (ref 150–400)
RBC: 3.21 MIL/uL — ABNORMAL LOW (ref 4.22–5.81)
RDW: 15.4 % (ref 11.5–15.5)
WBC Morphology: INCREASED
WBC: 3.8 10*3/uL — ABNORMAL LOW (ref 4.0–10.5)
nRBC: 0 % (ref 0.0–0.2)

## 2020-01-01 LAB — CREATININE, SERUM
Creatinine, Ser: 1.8 mg/dL — ABNORMAL HIGH (ref 0.61–1.24)
GFR calc Af Amer: 46 mL/min — ABNORMAL LOW (ref >60)
GFR calc non Af Amer: 39 mL/min — ABNORMAL LOW (ref >60)

## 2020-01-01 LAB — COMPREHENSIVE METABOLIC PANEL
ALT: 88 U/L — ABNORMAL HIGH (ref 0–44)
AST: 74 U/L — ABNORMAL HIGH (ref 15–41)
Albumin: 2.6 g/dL — ABNORMAL LOW (ref 3.5–5.0)
Alkaline Phosphatase: 61 U/L (ref 38–126)
Anion gap: 9 (ref 5–15)
BUN: 64 mg/dL — ABNORMAL HIGH (ref 8–23)
CO2: 22 mmol/L (ref 22–32)
Calcium: 8.6 mg/dL — ABNORMAL LOW (ref 8.9–10.3)
Chloride: 108 mmol/L (ref 98–111)
Creatinine, Ser: 1.97 mg/dL — ABNORMAL HIGH (ref 0.61–1.24)
GFR calc Af Amer: 41 mL/min — ABNORMAL LOW (ref >60)
GFR calc non Af Amer: 35 mL/min — ABNORMAL LOW (ref >60)
Glucose, Bld: 67 mg/dL — ABNORMAL LOW (ref 70–99)
Potassium: 6.5 mmol/L (ref 3.5–5.1)
Sodium: 139 mmol/L (ref 135–145)
Total Bilirubin: 0.7 mg/dL (ref 0.3–1.2)
Total Protein: 5.3 g/dL — ABNORMAL LOW (ref 6.5–8.1)

## 2020-01-01 LAB — APTT: aPTT: 48 seconds — ABNORMAL HIGH (ref 24–36)

## 2020-01-01 LAB — CBG MONITORING, ED
Glucose-Capillary: 66 mg/dL — ABNORMAL LOW (ref 70–99)
Glucose-Capillary: 243 mg/dL — ABNORMAL HIGH (ref 70–99)
Glucose-Capillary: 57 mg/dL — ABNORMAL LOW (ref 70–99)

## 2020-01-01 LAB — PROTIME-INR
INR: 1.3 — ABNORMAL HIGH (ref 0.8–1.2)
Prothrombin Time: 15.6 seconds — ABNORMAL HIGH (ref 11.4–15.2)

## 2020-01-01 LAB — URINALYSIS, ROUTINE W REFLEX MICROSCOPIC
Bilirubin Urine: NEGATIVE
Glucose, UA: 50 mg/dL — AB
Hgb urine dipstick: NEGATIVE
Ketones, ur: NEGATIVE mg/dL
Nitrite: POSITIVE — AB
Protein, ur: NEGATIVE mg/dL
Specific Gravity, Urine: 1.018 (ref 1.005–1.030)
pH: 5 (ref 5.0–8.0)

## 2020-01-01 LAB — LACTIC ACID, PLASMA: Lactic Acid, Venous: 1.3 mmol/L (ref 0.5–1.9)

## 2020-01-01 LAB — POC SARS CORONAVIRUS 2 AG -  ED: SARS Coronavirus 2 Ag: NEGATIVE

## 2020-01-01 LAB — POTASSIUM: Potassium: 6.3 mmol/L (ref 3.5–5.1)

## 2020-01-01 MED ORDER — NOREPINEPHRINE 4 MG/250ML-% IV SOLN
0.0000 ug/min | INTRAVENOUS | Status: DC
  Administered 2020-01-01: 3 ug/min via INTRAVENOUS
  Filled 2020-01-01: qty 250

## 2020-01-01 MED ORDER — METRONIDAZOLE IN NACL 5-0.79 MG/ML-% IV SOLN
500.0000 mg | Freq: Once | INTRAVENOUS | Status: AC
  Administered 2020-01-01: 500 mg via INTRAVENOUS
  Filled 2020-01-01: qty 100

## 2020-01-01 MED ORDER — SODIUM CHLORIDE 0.9 % IV SOLN
2.0000 g | INTRAVENOUS | Status: DC
  Administered 2020-01-02: 2 g via INTRAVENOUS
  Filled 2020-01-01: qty 2

## 2020-01-01 MED ORDER — DEXTROSE 50 % IV SOLN
1.0000 | Freq: Once | INTRAVENOUS | Status: AC
  Administered 2020-01-02: 50 mL via INTRAVENOUS
  Filled 2020-01-01: qty 50

## 2020-01-01 MED ORDER — HEPARIN SODIUM (PORCINE) 5000 UNIT/ML IJ SOLN: 5000.0000 [IU] | Freq: Three times a day (TID) | INTRAMUSCULAR | Status: DC

## 2020-01-01 MED ORDER — SODIUM CHLORIDE 0.9 % IV BOLUS (SEPSIS)
500.0000 mL | Freq: Once | INTRAVENOUS | Status: AC
  Administered 2020-01-01: 500 mL via INTRAVENOUS

## 2020-01-01 MED ORDER — SODIUM CHLORIDE 0.9 % IV SOLN
2.0000 g | Freq: Once | INTRAVENOUS | Status: AC
  Administered 2020-01-02: 2 g via INTRAVENOUS
  Filled 2020-01-01: qty 2

## 2020-01-01 MED ORDER — DEXTROSE 50 % IV SOLN
25.0000 mL | Freq: Once | INTRAVENOUS | Status: AC
  Administered 2020-01-01: 25 mL via INTRAVENOUS

## 2020-01-01 MED ORDER — HYDROCORTISONE NA SUCCINATE PF 100 MG IJ SOLR
100.0000 mg | Freq: Once | INTRAMUSCULAR | Status: AC
  Administered 2020-01-02: 100 mg via INTRAVENOUS
  Filled 2020-01-01: qty 2

## 2020-01-01 MED ORDER — VANCOMYCIN HCL IN DEXTROSE 1-5 GM/200ML-% IV SOLN: 1000.0000 mg | INTRAVENOUS | Status: DC

## 2020-01-01 MED ORDER — INSULIN ASPART 100 UNIT/ML IV SOLN
10.0000 [IU] | Freq: Once | INTRAVENOUS | Status: AC
  Administered 2020-01-02: 10 [IU] via INTRAVENOUS

## 2020-01-01 MED ORDER — HYDROCORTISONE NA SUCCINATE PF 100 MG IJ SOLR
50.0000 mg | Freq: Four times a day (QID) | INTRAMUSCULAR | Status: DC
  Administered 2020-01-01 – 2020-01-03 (×5): 50 mg via INTRAVENOUS
  Filled 2020-01-01 (×6): qty 2

## 2020-01-01 MED ORDER — VANCOMYCIN HCL IN DEXTROSE 1-5 GM/200ML-% IV SOLN
1000.0000 mg | Freq: Once | INTRAVENOUS | Status: AC
  Administered 2020-01-02: 1000 mg via INTRAVENOUS
  Filled 2020-01-01: qty 200

## 2020-01-01 MED ORDER — DEXTROSE 50 % IV SOLN
INTRAVENOUS | Status: AC
  Filled 2020-01-01: qty 50

## 2020-01-01 MED ORDER — VASOPRESSIN 20 UNIT/ML IV SOLN
0.0300 [IU]/min | INTRAVENOUS | Status: DC
  Filled 2020-01-01: qty 2

## 2020-01-01 MED ORDER — DEXTROSE 10 % IV SOLN: INTRAVENOUS | Status: DC

## 2020-01-01 MED ORDER — NOREPINEPHRINE 4 MG/250ML-% IV SOLN
INTRAVENOUS | Status: AC
  Administered 2020-01-01: 10 ug/min via INTRAVENOUS
  Filled 2020-01-01: qty 250

## 2020-01-01 MED ORDER — DEXAMETHASONE SODIUM PHOSPHATE 10 MG/ML IJ SOLN: 6.0000 mg | Freq: Once | INTRAMUSCULAR | Status: DC

## 2020-01-01 MED ORDER — RISPERIDONE 0.5 MG PO TABS
0.5000 mg | ORAL_TABLET | Freq: Two times a day (BID) | ORAL | Status: DC
  Filled 2020-01-01: qty 1

## 2020-01-01 MED ORDER — SODIUM CHLORIDE 0.9 % IV BOLUS (SEPSIS)
1000.0000 mL | Freq: Once | INTRAVENOUS | Status: AC
  Administered 2020-01-01: 1000 mL via INTRAVENOUS

## 2020-01-01 NOTE — ED Notes (Signed)
Per md moved to 9 levo

## 2020-01-01 NOTE — ED Provider Notes (Signed)
[facility] EMERGENCY DEPARTMENT Provider Note   CSN: [id number] Arrival date & time: 01/10/2020  1835     History Chief Complaint  Patient presents with  . Hypotension  . Bradycardia    Richard Greene is a 64 y.o. male.  HPI Sent in from group home with complaint of hypotension and bradycardia.  At baseline patient is reportedly nonverbal.  He does have history of Down syndrome mental retardation with multiple comorbid illnesses.  Very limited additional history.  Nursing staff got history that patient "is not a big eater or drinker".  Also there was a reports of the patient had been constipated and was given a laxative.  Reportedly, since that time he has had diarrhea.  Apparently, the patient fell to the floor yesterday with a possible syncopal episode and has been increasingly lethargic today.    Past Medical History:  Diagnosis Date  . Constipation   . Down syndrome   . Dyspepsia   . Gout, unspecified   . Hypokalemia 07/2013  . Obesity, unspecified   . Osteoarthrosis, unspecified whether generalized or localized, unspecified site   . Pure hypercholesterolemia     Patient Active Problem List   Diagnosis Date Noted  . Occult blood positive stool 12/08/2018  . SIRS (systemic inflammatory response syndrome) (HCC) 12/08/2018  . Sepsis (HCC) 12/07/2018  . Hyperkalemia 12/07/2018  . Acute renal failure superimposed on stage 2 chronic kidney disease (HCC) 12/07/2018  . Dehydration   . Convulsions/seizures (HCC) 08/05/2013  . Hypokalemia 08/03/2013  . Acute renal failure (HCC) 08/03/2013  . Nausea with vomiting 08/03/2013  . Diarrhea 08/03/2013  . Down syndrome 08/03/2013  . gastroenteritis and colitis 08/03/2013  . Adrenal insufficiency (HCC) 08/03/2013  . UTI (urinary tract infection) 08/03/2013  . Constipation 12/14/2012  . Gout 12/14/2012  . Colonic dysfunction 12/14/2012  . Degenerative joint disease 12/14/2012  . Periodontal disease 12/14/2012  .  Mental retardation 12/14/2012    Past Surgical History:  Procedure Laterality Date  . NO PAST SURGERIES         History reviewed. No pertinent family history.  Social History   Tobacco Use  . Smoking status: Never Smoker  . Smokeless tobacco: Never Used  Substance Use Topics  . Alcohol use: No  . Drug use: No    Home Medications Prior to Admission medications   Medication Sig Start Date End Date Taking? Authorizing Provider  allopurinol (ZYLOPRIM) 100 MG tablet Take 100 mg by mouth daily. 11/24/18   [provider]  loperamide (IMODIUM) 2 MG capsule Take 1 capsule (2 mg total) by mouth 4 (four) times daily as needed for diarrhea or loose stools. 08/06/13   Christiane Ha, MD  risperiDONE (RISPERDAL) 0.5 MG tablet Take 0.5 mg by mouth 2 (two) times daily. 11/15/18   [provider]    Allergies    Patient has no known allergies.  Review of Systems   Review of Systems 5 caveat cannot obtain review of systems due to patient condition. Physical Exam Updated Vital Signs BP (!) 67/46   Pulse (!) 47   Temp (!) 85 F (29.4 C) (Rectal)   Resp (!) 7   Ht 5\' 2"  (1.575 m)   Wt 49.9 kg   SpO2 100%   BMI 20.12 kg/m   Physical Exam Constitutional:      Comments: Patient has chronically ill appearance.  He is pale.  Patient is awake with eyes open.  No respiratory distress.  HENT:  Head: Normocephalic and atraumatic.     Comments: Cannot appreciate any hematomas or lacerations to the scalp or the face.    Nose: Nose normal.     Mouth/Throat:     Comments: Mouth is dry airway is clear. Eyes:     Comments: Patient has a baseline appearing strabismus.  Likely some cataract.  Unclear if patient has underlying pre-existing blindness.  Cardiovascular:     Comments: Bradycardia.  No gross rub murmur gallop. Pulmonary:     Effort: Pulmonary effort is normal.     Breath sounds: Normal breath sounds.  Abdominal:     Comments: Abdomen is soft.  Patient  does not guard with palpation.  Not significantly distended.  Musculoskeletal:     Cervical back: Neck supple.     Comments: Patient has some peripheral edema of the lower extremities.  No appearance of traumatic deformities.  Skin:    General: Skin is warm and dry.     Coloration: Skin is pale.  Neurological:     Comments: Patient is nonverbal.  He does appear to be awake and turns his head in the direction of voices.  He does not follow any commands.     ED Results / Procedures / Treatments   Labs (all labs ordered are listed, but only abnormal results are displayed) Labs Reviewed  COMPREHENSIVE METABOLIC PANEL - Abnormal; Notable for the following components:      Result Value   Potassium 6.5 (*)    Glucose, Bld 67 (*)    BUN 64 (*)    Creatinine, Ser 1.97 (*)    Calcium 8.6 (*)    Total Protein 5.3 (*)    Albumin 2.6 (*)    AST 74 (*)    ALT 88 (*)    GFR calc non Af Amer 35 (*)    GFR calc Af Amer 41 (*)    All other components within normal limits  CBC WITH DIFFERENTIAL/PLATELET - Abnormal; Notable for the following components:   WBC 3.8 (*)    RBC 3.21 (*)    Hemoglobin 10.6 (*)    HCT 31.7 (*)    Platelets 58 (*)    Lymphs Abs 0.6 (*)    All other components within normal limits  APTT - Abnormal; Notable for the following components:   aPTT 48 (*)    All other components within normal limits  PROTIME-INR - Abnormal; Notable for the following components:   Prothrombin Time 15.6 (*)    INR 1.3 (*)    All other components within normal limits  CBG MONITORING, ED - Abnormal; Notable for the following components:   Glucose-Capillary 66 (*)    All other components within normal limits  CBG MONITORING, ED - Abnormal; Notable for the following components:   Glucose-Capillary 57 (*)    All other components within normal limits  CBG MONITORING, ED - Abnormal; Notable for the following components:   Glucose-Capillary 243 (*)    All other components within normal  limits  CULTURE, BLOOD (ROUTINE X 2)  CULTURE, BLOOD (ROUTINE X 2)  URINE CULTURE  LACTIC ACID, PLASMA  LACTIC ACID, PLASMA  URINALYSIS, ROUTINE W REFLEX MICROSCOPIC  POC SARS CORONAVIRUS 2 AG -  ED    EKG EKG Interpretation  Date/Time:  2020-01-14 18:45:11 EST Ventricular Rate:  47 PR Interval:    QRS Duration: 132 QT Interval:  566 QTC Calculation: 501 R Axis:   76 Text Interpretation: Sinus bradycardia Consider left  atrial enlargement Nonspecific intraventricular conduction delay No acute changes No significant change since last tracing Confirmed by Derwood Kaplan [phone]) on 01/02/2020 10:25:25 PM   Radiology DG Chest Port 1 View  Result Date: 01/02/2020 CLINICAL DATA:  Sepsis EXAM: PORTABLE CHEST 1 VIEW COMPARISON:  03/15/2015 FINDINGS: Chronic elevation of left diaphragm. No consolidation or effusion. Probable hazy atelectasis left base. Normal heart size. No pneumothorax. IMPRESSION: Chronic elevation of the left diaphragm with probable hazy atelectasis at the left base. No focal pulmonary infiltrate. Electronically Signed   By: Jasmine Pang M.D.   On: 01/25/2020 19:48    Procedures Procedures (including critical care time) CRITICAL CARE Performed by: Arby Barrette   Total critical care time: 60 minutes  Critical care time was exclusive of separately billable procedures and treating other patients.  Critical care was necessary to treat or prevent imminent or life-threatening deterioration.  Critical care was time spent personally by me on the following activities: development of treatment plan with patient and/or surrogate as well as nursing, discussions with consultants, evaluation of patient's response to treatment, examination of patient, obtaining history from patient or surrogate, ordering and performing treatments and interventions, ordering and review of laboratory studies, ordering and review of radiographic studies, pulse oximetry and  re-evaluation of patient's condition. Medications Ordered in ED Medications  ceFEPIme (MAXIPIME) 2 g in sodium chloride 0.9 % 100 mL IVPB (has no administration in time range)  metroNIDAZOLE (FLAGYL) IVPB 500 mg (has no administration in time range)  vancomycin (VANCOCIN) IVPB 1000 mg/200 mL premix (has no administration in time range)  norepinephrine (LEVOPHED) 4-5 MG/250ML-% infusion SOLN (has no administration in time range)  dextrose 50 % solution (has no administration in time range)  norepinephrine (LEVOPHED) 4mg  in premix infusion (4 mcg/min Intravenous Rate/Dose Verify 01/10/2020 1912)  dextrose 50 % solution (has no administration in time range)  dextrose 10 % infusion (has no administration in time range)  sodium chloride 0.9 % bolus 1,000 mL (1,000 mLs Intravenous New Bag/Given 01/18/2020 1905)    And  sodium chloride 0.9 % bolus 500 mL (500 mLs Intravenous New Bag/Given 01/17/2020 1906)  dextrose 50 % solution 25 mL (25 mLs Intravenous Given 01/14/2020 1905)    ED Course  I have reviewed the triage vital signs and the nursing notes.  Pertinent labs & imaging results that were available during my care of the patient were reviewed by me and considered in my medical decision making (see chart for details).  Clinical Course as of Jan 04 1529  Sat Jan 01, 2020  2057 Consult: Reviewed with Dr. Jan 03, 2020 intensivist.  Recommends vasopressin at shock dosing, a random cortisol and hydrocortisone 50 mg every 6 hours.  Will see ASAP in ED   [MP]  2153 Pressures are not updating.  I have rechecked the patient and current readings are 200s systolic.  This is up from prior readings around 100 systolic.  The patient has not been given his vasopressin or Solu-Cortef.  Will have nursing recheck blood pressure with manual cuff and start to titrate down Levophed.   [MP]    Clinical Course User Index [MP] 2154, MD   MDM Rules/Calculators/A&P                      Patient presents with  appearance of sepsis and severe illness.  Source unclear.  Patient has significant underlying comorbid conditions.  He arrives significantly hypotensive.  Airway is protected.  Patient does not appear to  have respiratory distress.  Will start sepsis protocol and fluid bolus with initiation of Levophed. Systolic pressures appeared to respond to Levophed with significant jump from potential into hypertension.  I did have consultation with Dr. Dellie Catholic.  We will proceed with Solu-Cortef and vasopressin. Blood pressures have been labile in response to vasopressors.  Critical care assuming ongoing management.  JONCARLO FRIBERG was evaluated in Emergency Department on 01/05/2020 for the symptoms described in the history of present illness. He was evaluated in the context of the global COVID-19 pandemic, which necessitated consideration that the patient might be at risk for infection with the SARS-CoV-2 virus that causes COVID-19. Institutional protocols and algorithms that pertain to the evaluation of patients at risk for COVID-19 are in a state of rapid change based on information released by regulatory bodies including the CDC and federal and state organizations. These policies and algorithms were followed during the patient's care in the ED. Final Clinical Impression(s) / ED Diagnoses Final diagnoses:  Sepsis [facility])    Rx / DC Orders ED Discharge Orders    None       Arby Barrette, MD 01/05/20 1542

## 2020-01-01 NOTE — ED Notes (Signed)
Moved to 8 levo

## 2020-01-01 NOTE — H&P (Addendum)
NAME:  Richard Greene, MRN:  [id number], DOB:  19-Jan-1956, LOS: 0 ADMISSION DATE:  01/11/2020, CONSULTATION DATE:  12/31/2019 REFERRING MD: Broadus John , CHIEF COMPLAINT:  sepsis   Brief History   64 y.o. M who resides in a group home with PMH of Down's Syndrome, Gout, adrenal insufficiency not on steroids, HL, frequent constipation who presents with diarrhea after taking laxatives for constipation.  Had a syncopal episode yesterday and then has been lethargic and hypotensive so brought to the ED where patient was requiring Levophed for suspected sepsis, so PCCM consulted for admission.  History of present illness   Richard Greene is a 64 y.o.M with PMH as above who presents with lethargy, bradycardia, hypotension, diarrhea after using laxatives and hypothermia.   In the ED, BP was as low as 58/50, given 3 L IVF and started on levophed, T 86.54F and HR 50's.   Labs were significant for WBC of 3.8, creatinine 1.8, platelets 58, glucose 57, Potassium 6.5.  He was placed on Bair hugger, given 1.5L IVF, Solucortef 50mg , Vancomycin, Flagyl and Cefepime, insulin and D50, started on Levophed which was stopped and re-started as BP very labuile.  CXR without infiltrate. Urinalysis,CT chest, abdomen and pelvis are pending.   Past Medical History   has a past medical history of Constipation, Down syndrome, Dyspepsia, Gout, unspecified, Hypokalemia (07/2013), Obesity, unspecified, Osteoarthrosis, unspecified whether generalized or localized, unspecified site, and Pure hypercholesterolemia.   Significant Hospital Events   1/2 admit to PCCM  Consults:    Procedures:  1/2 right radial A-line  Significant Diagnostic Tests:  1/2 CXR>>Chronic elevation of the left diaphragm with probable hazyatelectasis at the left base. 1/2 CT chest/abdomen/pelvis>>Extensive periportal edema and gallbladder wall thickening, periportal edema may be secondary to the patient's volume status or be related to acute hepatitis, There is a  developing airspace opacity at the left lung base, concerning for pneumonia, moderate to severe rectal wall thickening  Micro Data:  1/2 Covid-19 and influenza>> negative 1/2 blood cultures x2>> 1/2 urine culture>> 1/3 sputum culture>>  Antimicrobials:  Vancomycin 1/2- Flagyl 1/2- Cefepime 1/2-  Interim history/subjective:  Patient was given 3 L of IV fluid and 150 mg hydrocortisone in the emergency department along with broad spectrum antibiotics  Objective   Blood pressure (!) 58/50, pulse (!) 51, temperature (!) 86.9 F (30.5 C), temperature source Core, resp. rate 17, height 5\' 2"  (1.575 m), weight 49.9 kg, SpO2 100 %.        Intake/Output Summary (Last 24 hours) at 01/02/2020 2201 Last data filed at 01/06/2020 1912 Gross per 24 hour  Intake 1.58 ml  Output -  Net 1.58 ml   Filed Weights   01/08/2020 1851  Weight: 49.9 kg    General: Elderly male, chronically ill-appearing in no distress HEENT: MM pink/dry Neuro: Opens eyes to voice, pupils equal and reactive, withdraws to pain, otherwise not responding to commands CV: s1s2 regular rate and rhythm, bradycardic, no m/r/g PULM: Decreased air movement bilateral bases, no wheezing no rhonchi GI: soft, bsx4 active  Extremities: warm/dry, no edema  Skin: no rashes or lesions   Resolved Hospital Problem list   None  Assessment & Plan:   Hypotension, lethargy and bradycardia likely secondary to concurrent sepsis physiology/shock adrenal insufficiency -Patient is not on home steroids, it appears that when he has been hospitalized in the past he required stress dose steroids, only cortisol level in epic is 28.5 in 2014 -Multiple possible nidus of infection,CT abdomen pelvis with gallbladder wall  and rectal wall thickening, possible developing pneumonia CT chest, UA positive nitrite P: -Patient has received 30 cc/kg IV fluids in the ED, continue maintenance fluids and peripheral Levophed, may need central line placed if  pressor requirement is increasing -Check C. difficile, blood and urine cultures, respiratory cultures -Continue broad-spectrum antibiotics with Vanco, cefepime, Flagyl until nidus of infection more clear and cultures result -Cortisol level pending, given bolus of 150 mg hydrocortisone, continue stress dose steroids with 50 mg every 6hrs   Periportal edema with gallbladder wall thickening -Very mildly elevated LFTs with normal bilirubin P: -Check right upper quadrant ultrasound along with acute hepatitis panel   Hyperkalemia -Likely secondary to both adrenal insufficiency and renal injury P: -Trend creatinine after IV fluids, Place Foley catheter -Given 10 units of IV insulin with D50 x2 -No peak T waves on EKG, QTC 501 -Treat with 2 g calcium gluconate   Acute kidney injury -Likely secondary to sepsis and volume depletion P: -Good urine output, renal function, continue maintenance fluids and avoid nephrotoxins -ABG pending, patient is trending acidotic may need bicarb  Thrombocytopenia -Platelets 58 -Suspect secondary to critical illness, possible gram-negative sepsis P: -Hold prophylactic heparin, SCDs for DVT prophylaxis -Repeat CBC in the morning transfuse for platelets less than 10 K    Best practice:  Diet: N.p.o. Pain/Anxiety/Delirium protocol (if indicated): n/a VAP protocol (if indicated): N/AA DVT prophylaxis: SCD's GI prophylaxis: protonix Glucose control: ssi Mobility: bed rest Code Status: full Family Communication: will attempt to reach brother Disposition: ICU  Labs   CBC: Recent Labs  Lab 01/02/2020 1928  WBC 3.8*  NEUTROABS 2.6  HGB 10.6*  HCT 31.7*  MCV 98.8  PLT 58*    Basic Metabolic Panel: Recent Labs  Lab 01/27/2020 1928  NA 139  K 6.5*  CL 108  CO2 22  GLUCOSE 67*  BUN 64*  CREATININE 1.97*  CALCIUM 8.6*   GFR: Estimated Creatinine Clearance: 27.4 mL/min (A) (by C-G formula based on SCr of 1.97 mg/dL (H)). Recent Labs  Lab  12/31/2019 1928 01/06/2020 1933  WBC 3.8*  --   LATICACIDVEN  --  1.3    Liver Function Tests: Recent Labs  Lab 01/18/2020 1928  AST 74*  ALT 88*  ALKPHOS 61  BILITOT 0.7  PROT 5.3*  ALBUMIN 2.6*   No results for input(s): LIPASE, AMYLASE in the last 168 hours. No results for input(s): AMMONIA in the last 168 hours.  ABG    Component Value Date/Time   HCO3 23.8 12/07/2018 1732   ACIDBASEDEF 3.1 (H) 12/07/2018 1732   O2SAT 48.4 12/07/2018 1732     Coagulation Profile: Recent Labs  Lab 01/25/2020 1928  INR 1.3*    Cardiac Enzymes: No results for input(s): CKTOTAL, CKMB, CKMBINDEX, TROPONINI in the last 168 hours.  HbA1C: No results found for: HGBA1C  CBG: Recent Labs  Lab 01/29/2020 1842 01/07/2020 1932 01/29/2020 2028  GLUCAP 66* 57* 243*    Review of Systems:   Unable to obtain secondary to altered mental status  Past Medical History  He,  has a past medical history of Constipation, Down syndrome, Dyspepsia, Gout, unspecified, Hypokalemia (07/2013), Obesity, unspecified, Osteoarthrosis, unspecified whether generalized or localized, unspecified site, and Pure hypercholesterolemia.   Surgical History    Past Surgical History:  Procedure Laterality Date  . NO PAST SURGERIES       Social History   reports that he has never smoked. He has never used smokeless tobacco. He reports that he does not drink  alcohol or use drugs.   Family History   His family history is not on file.   Allergies No Known Allergies   Home Medications  Prior to Admission medications   Medication Sig Start Date End Date Taking? Authorizing Provider  allopurinol (ZYLOPRIM) 100 MG tablet Take 100 mg by mouth daily. 11/24/18   [provider]  loperamide (IMODIUM) 2 MG capsule Take 1 capsule (2 mg total) by mouth 4 (four) times daily as needed for diarrhea or loose stools. 08/06/13   Christiane Ha, MD  risperiDONE (RISPERDAL) 0.5 MG tablet Take 0.5 mg by mouth 2 (two) times  daily. 11/15/18   [provider]     Critical care time: 65 minutes    CRITICAL CARE Performed by: [name] Prabhleen Montemayor   Total critical care time: 65 minutes  Critical care time was exclusive of separately billable procedures and treating other patients.  Critical care was necessary to treat or prevent imminent or life-threatening deterioration.  Critical care was time spent personally by me on the following activities: development of treatment plan with patient and/or surrogate as well as nursing, discussions with consultants, evaluation of patient's response to treatment, examination of patient, obtaining history from patient or surrogate, ordering and performing treatments and interventions, ordering and review of laboratory studies, ordering and review of radiographic studies, pulse oximetry and re-evaluation of patient's condition.  [name] Lenetta Piche, PA-C [facility]  Pager# [phone], if no answer [phone]

## 2020-01-01 NOTE — Progress Notes (Signed)
Pharmacy Antibiotic Note  Richard Greene is a 64 y.o. male admitted on 01/29/2020 with sepsis.  Pharmacy has been consulted for vancomycin and cefepime dosing. Pt is hypothermic and WBC is low. Scr is elevated above patients baseline at 1.97. Lactic acid is <2.   Plan: Vancomycin 1gm IV Q48H Cefepime 2gm IV Q24H F/u renal fxn, C&S, clinical status and peak/trough at SS  Height: 5\' 2"  (157.5 cm) Weight: 110 lb (49.9 kg) IBW/kg (Calculated) : 54.6  Temp (24hrs), Avg:85 F (29.4 C), Min:85 F (29.4 C), Max:85 F (29.4 C)  Recent Labs  Lab 01/18/2020 1928 01/23/2020 1933  WBC 3.8*  --   CREATININE 1.97*  --   LATICACIDVEN  --  1.3    Estimated Creatinine Clearance: 27.4 mL/min (A) (by C-G formula based on SCr of 1.97 mg/dL (H)).    No Known Allergies  Antimicrobials this admission: Vanc 1/2>> Cefepime 1/2>> Flagyl x 1 1/2  Dose adjustments this admission: N/A  Microbiology results: Pending  Thank you for allowing pharmacy to be a part of this patient's care.  Cote Mayabb, 02/29/20 01/11/2020 6:58 PM

## 2020-01-01 NOTE — ED Notes (Signed)
1 amp of d50 given per verbal order of MD

## 2020-01-01 NOTE — ED Notes (Signed)
Levo moveed to 6

## 2020-01-01 NOTE — ED Notes (Addendum)
Critical Care response just left levo at 6

## 2020-01-01 NOTE — Procedures (Signed)
Arterial Catheter Insertion Procedure Note Richard Greene [id number] 1956-02-26  Procedure: Insertion of Arterial Catheter  Indications: Blood pressure monitoring  Procedure Details Consent: Unable to obtain consent because of altered level of consciousness. Time Out: Verified patient identification, verified procedure, site/side was marked, verified correct patient position, special equipment/implants available, medications/allergies/relevent history reviewed, required imaging and test results available.  Performed  Maximum sterile technique was used including antiseptics, gloves, hand hygiene, mask and sheet. Skin prep: Chlorhexidine; local anesthetic administered 20 gauge catheter was inserted into right radial artery using the Seldinger technique. ULTRASOUND GUIDANCE USED: YES Evaluation Blood flow good; BP tracing good. Complications: No apparent complications.   [name] [name] 01/21/2020

## 2020-01-01 NOTE — ED Notes (Signed)
Moved to 6 mcg levo

## 2020-01-01 NOTE — ED Notes (Signed)
Moved down to levo 9 last two bp[ 155/140 range

## 2020-01-01 NOTE — ED Notes (Signed)
Moved to 7, Bp was doing well with Map at 61 but has dropped since than

## 2020-01-01 NOTE — ED Triage Notes (Signed)
Pt BIB [facility] from group home. CC of hypotension and bradycardia. Per group home pt is not a bigger eater or drinker. Pt was constipated NYE was given laxative. Since pt has been having diarrhea. Pt also had syncopal episode yesterday and fell to floor. Pt has been increasingly lethargic today. Pt given 1000cc NS and .5 atrovent.

## 2020-01-01 NOTE — ED Notes (Signed)
Titrated to 10 pressure went up to high 157/11, tghen went back down to 6 but bp fell again 70/44. Will go up again in 2 minutes

## 2020-01-01 NOTE — ED Notes (Signed)
Back up to 7 on levo

## 2020-01-02 ENCOUNTER — Inpatient Hospital Stay (HOSPITAL_COMMUNITY): Payer: Medicare Other

## 2020-01-02 DIAGNOSIS — W07XXXA Fall from chair, initial encounter: Secondary | ICD-10-CM

## 2020-01-02 DIAGNOSIS — I361 Nonrheumatic tricuspid (valve) insufficiency: Secondary | ICD-10-CM

## 2020-01-02 DIAGNOSIS — E639 Nutritional deficiency, unspecified: Secondary | ICD-10-CM

## 2020-01-02 DIAGNOSIS — J189 Pneumonia, unspecified organism: Secondary | ICD-10-CM

## 2020-01-02 DIAGNOSIS — A419 Sepsis, unspecified organism: Secondary | ICD-10-CM

## 2020-01-02 DIAGNOSIS — I340 Nonrheumatic mitral (valve) insufficiency: Secondary | ICD-10-CM

## 2020-01-02 DIAGNOSIS — I959 Hypotension, unspecified: Secondary | ICD-10-CM | POA: Insufficient documentation

## 2020-01-02 DIAGNOSIS — K828 Other specified diseases of gallbladder: Secondary | ICD-10-CM

## 2020-01-02 LAB — COMPREHENSIVE METABOLIC PANEL
ALT: 106 U/L — ABNORMAL HIGH (ref 0–44)
AST: 87 U/L — ABNORMAL HIGH (ref 15–41)
Albumin: 2.1 g/dL — ABNORMAL LOW (ref 3.5–5.0)
Alkaline Phosphatase: 74 U/L (ref 38–126)
Anion gap: 10 (ref 5–15)
BUN: 46 mg/dL — ABNORMAL HIGH (ref 8–23)
CO2: 19 mmol/L — ABNORMAL LOW (ref 22–32)
Calcium: 7.4 mg/dL — ABNORMAL LOW (ref 8.9–10.3)
Chloride: 104 mmol/L (ref 98–111)
Creatinine, Ser: 1.99 mg/dL — ABNORMAL HIGH (ref 0.61–1.24)
GFR calc Af Amer: 41 mL/min — ABNORMAL LOW (ref >60)
GFR calc non Af Amer: 35 mL/min — ABNORMAL LOW (ref >60)
Glucose, Bld: 213 mg/dL — ABNORMAL HIGH (ref 70–99)
Potassium: 4.5 mmol/L (ref 3.5–5.1)
Sodium: 133 mmol/L — ABNORMAL LOW (ref 135–145)
Total Bilirubin: 0.7 mg/dL (ref 0.3–1.2)
Total Protein: 4.6 g/dL — ABNORMAL LOW (ref 6.5–8.1)

## 2020-01-02 LAB — POCT I-STAT 7, (LYTES, BLD GAS, ICA,H+H)
Acid-base deficit: 4 mmol/L — ABNORMAL HIGH (ref 0.0–2.0)
Acid-base deficit: 13 mmol/L — ABNORMAL HIGH (ref 0.0–2.0)
Acid-base deficit: 3 mmol/L — ABNORMAL HIGH (ref 0.0–2.0)
Bicarbonate: 22.2 mmol/L (ref 20.0–28.0)
Bicarbonate: 13.4 mmol/L — ABNORMAL LOW (ref 20.0–28.0)
Bicarbonate: 21.5 mmol/L (ref 20.0–28.0)
Calcium, Ion: 1.18 mmol/L (ref 1.15–1.40)
Calcium, Ion: 1.22 mmol/L (ref 1.15–1.40)
Calcium, Ion: 1.07 mmol/L — ABNORMAL LOW (ref 1.15–1.40)
HCT: 30 % — ABNORMAL LOW (ref 39.0–52.0)
HCT: 25 % — ABNORMAL LOW (ref 39.0–52.0)
HCT: 28 % — ABNORMAL LOW (ref 39.0–52.0)
Hemoglobin: 10.2 g/dL — ABNORMAL LOW (ref 13.0–17.0)
Hemoglobin: 8.5 g/dL — ABNORMAL LOW (ref 13.0–17.0)
Hemoglobin: 9.5 g/dL — ABNORMAL LOW (ref 13.0–17.0)
O2 Saturation: 98 %
O2 Saturation: 97 %
O2 Saturation: 97 %
Patient temperature: 89.5
Patient temperature: 34.4
Patient temperature: 98.7
Potassium: 5.7 mmol/L — ABNORMAL HIGH (ref 3.5–5.1)
Potassium: 4.3 mmol/L (ref 3.5–5.1)
Potassium: 4.5 mmol/L (ref 3.5–5.1)
Sodium: 141 mmol/L (ref 135–145)
Sodium: 143 mmol/L (ref 135–145)
Sodium: 137 mmol/L (ref 135–145)
TCO2: 24 mmol/L (ref 22–32)
TCO2: 14 mmol/L — ABNORMAL LOW (ref 22–32)
TCO2: 23 mmol/L (ref 22–32)
pCO2 arterial: 35.6 mmHg (ref 32.0–48.0)
pCO2 arterial: 28.7 mmHg — ABNORMAL LOW (ref 32.0–48.0)
pCO2 arterial: 36.1 mmHg (ref 32.0–48.0)
pH, Arterial: 7.378 (ref 7.350–7.450)
pH, Arterial: 7.264 — ABNORMAL LOW (ref 7.350–7.450)
pH, Arterial: 7.384 (ref 7.350–7.450)
pO2, Arterial: 94 mmHg (ref 83.0–108.0)
pO2, Arterial: 91 mmHg (ref 83.0–108.0)
pO2, Arterial: 92 mmHg (ref 83.0–108.0)

## 2020-01-02 LAB — CBC
HCT: 27.6 % — ABNORMAL LOW (ref 39.0–52.0)
Hemoglobin: 9.4 g/dL — ABNORMAL LOW (ref 13.0–17.0)
MCH: 33.9 pg (ref 26.0–34.0)
MCHC: 34.1 g/dL (ref 30.0–36.0)
MCV: 99.6 fL (ref 80.0–100.0)
Platelets: 69 10*3/uL — ABNORMAL LOW (ref 150–400)
RBC: 2.77 MIL/uL — ABNORMAL LOW (ref 4.22–5.81)
RDW: 15.9 % — ABNORMAL HIGH (ref 11.5–15.5)
WBC: 5.9 10*3/uL (ref 4.0–10.5)
nRBC: 0.5 % — ABNORMAL HIGH (ref 0.0–0.2)

## 2020-01-02 LAB — HIV ANTIBODY (ROUTINE TESTING W REFLEX): HIV Screen 4th Generation wRfx: NONREACTIVE

## 2020-01-02 LAB — BASIC METABOLIC PANEL
Anion gap: 13 (ref 5–15)
BUN: 51 mg/dL — ABNORMAL HIGH (ref 8–23)
CO2: 14 mmol/L — ABNORMAL LOW (ref 22–32)
Calcium: 7.7 mg/dL — ABNORMAL LOW (ref 8.9–10.3)
Chloride: 112 mmol/L — ABNORMAL HIGH (ref 98–111)
Creatinine, Ser: 1.93 mg/dL — ABNORMAL HIGH (ref 0.61–1.24)
GFR calc Af Amer: 42 mL/min — ABNORMAL LOW (ref >60)
GFR calc non Af Amer: 36 mL/min — ABNORMAL LOW (ref >60)
Glucose, Bld: 246 mg/dL — ABNORMAL HIGH (ref 70–99)
Potassium: 4.7 mmol/L (ref 3.5–5.1)
Sodium: 139 mmol/L (ref 135–145)

## 2020-01-02 LAB — RESPIRATORY PANEL BY RT PCR (FLU A&B, COVID)
Influenza A by PCR: NEGATIVE
Influenza B by PCR: NEGATIVE
SARS Coronavirus 2 by RT PCR: NEGATIVE

## 2020-01-02 LAB — COOXEMETRY PANEL
Carboxyhemoglobin: 0.1 % — ABNORMAL LOW (ref 0.5–1.5)
Carboxyhemoglobin: 1.1 % (ref 0.5–1.5)
Methemoglobin: 1.1 % (ref 0.0–1.5)
Methemoglobin: 1.8 % — ABNORMAL HIGH (ref 0.0–1.5)
O2 Saturation: 96.8 %
O2 Saturation: 58.2 %
Total hemoglobin: 9.3 g/dL — ABNORMAL LOW (ref 12.0–16.0)
Total hemoglobin: 9.2 g/dL — ABNORMAL LOW (ref 12.0–16.0)

## 2020-01-02 LAB — GLUCOSE, CAPILLARY
Glucose-Capillary: 134 mg/dL — ABNORMAL HIGH (ref 70–99)
Glucose-Capillary: 205 mg/dL — ABNORMAL HIGH (ref 70–99)
Glucose-Capillary: 88 mg/dL (ref 70–99)

## 2020-01-02 LAB — ECHOCARDIOGRAM COMPLETE
Height: 62 in
Weight: 1887.14 oz

## 2020-01-02 LAB — C DIFFICILE QUICK SCREEN W PCR REFLEX
C Diff antigen: NEGATIVE
C Diff interpretation: NOT DETECTED
C Diff toxin: NEGATIVE

## 2020-01-02 LAB — MRSA PCR SCREENING: MRSA by PCR: POSITIVE — AB

## 2020-01-02 LAB — HEPATITIS PANEL, ACUTE
HCV Ab: NONREACTIVE
Hep A IgM: NONREACTIVE
Hep B C IgM: NONREACTIVE
Hepatitis B Surface Ag: NONREACTIVE

## 2020-01-02 LAB — LACTIC ACID, PLASMA
Lactic Acid, Venous: 7.2 mmol/L (ref 0.5–1.9)
Lactic Acid, Venous: 6.3 mmol/L (ref 0.5–1.9)
Lactic Acid, Venous: 5.2 mmol/L (ref 0.5–1.9)

## 2020-01-02 LAB — CORTISOL: Cortisol, Plasma: >100 ug/dL

## 2020-01-02 LAB — CBG MONITORING, ED: Glucose-Capillary: 176 mg/dL — ABNORMAL HIGH (ref 70–99)

## 2020-01-02 MED ORDER — LACTATED RINGERS IV SOLN: INTRAVENOUS | Status: DC

## 2020-01-02 MED ORDER — SODIUM CHLORIDE 0.9 % IV SOLN
1.2500 ng/kg/min | INTRAVENOUS | Status: DC
  Administered 2020-01-02: 1.25 ng/kg/min via INTRAVENOUS
  Filled 2020-01-02: qty 1

## 2020-01-02 MED ORDER — VASOPRESSIN 20 UNIT/ML IV SOLN
0.0300 [IU]/min | INTRAVENOUS | Status: DC
  Administered 2020-01-02 (×2): 0.03 [IU]/min via INTRAVENOUS
  Filled 2020-01-02 (×2): qty 2

## 2020-01-02 MED ORDER — CHLORHEXIDINE GLUCONATE 0.12 % MT SOLN
15.0000 mL | Freq: Two times a day (BID) | OROMUCOSAL | Status: DC
  Administered 2020-01-02: 15 mL via OROMUCOSAL
  Filled 2020-01-02: qty 15

## 2020-01-02 MED ORDER — CALCIUM GLUCONATE-NACL 2-0.675 GM/100ML-% IV SOLN
2.0000 g | Freq: Once | INTRAVENOUS | Status: AC
  Administered 2020-01-02: 2000 mg via INTRAVENOUS
  Filled 2020-01-02: qty 100

## 2020-01-02 MED ORDER — CHLORHEXIDINE GLUCONATE CLOTH 2 % EX PADS
6.0000 | MEDICATED_PAD | Freq: Every day | CUTANEOUS | Status: DC
  Administered 2020-01-02: 6 via TOPICAL

## 2020-01-02 MED ORDER — FUROSEMIDE 10 MG/ML IJ SOLN
40.0000 mg | Freq: Once | INTRAMUSCULAR | Status: AC
  Administered 2020-01-02: 40 mg via INTRAVENOUS
  Filled 2020-01-02: qty 4

## 2020-01-02 MED ORDER — ALBUMIN HUMAN 5 % IV SOLN
12.5000 g | Freq: Once | INTRAVENOUS | Status: AC
  Administered 2020-01-02: 12.5 g via INTRAVENOUS
  Filled 2020-01-02: qty 250

## 2020-01-02 MED ORDER — EPINEPHRINE (ANAPHYLAXIS) 30 MG/30ML IJ SOLN
0.5000 ug/min | INTRAVENOUS | Status: DC
  Administered 2020-01-02 (×2): 20 ug/min via INTRAVENOUS
  Administered 2020-01-03: 2 ug/min via INTRAVENOUS
  Filled 2020-01-02 (×5): qty 8

## 2020-01-02 MED ORDER — SODIUM BICARBONATE-DEXTROSE 150-5 MEQ/L-% IV SOLN
150.0000 meq | INTRAVENOUS | Status: DC
  Administered 2020-01-02 – 2020-01-03 (×3): 150 meq via INTRAVENOUS
  Filled 2020-01-02 (×3): qty 1000

## 2020-01-02 MED ORDER — FOLIC ACID 5 MG/ML IJ SOLN
1.0000 mg | Freq: Every day | INTRAMUSCULAR | Status: DC
  Administered 2020-01-02: 1 mg via INTRAVENOUS
  Filled 2020-01-02 (×2): qty 0.2

## 2020-01-02 MED ORDER — THIAMINE HCL 100 MG/ML IJ SOLN: 100.0000 mg | INTRAMUSCULAR | Status: DC

## 2020-01-02 MED ORDER — VASOPRESSIN 20 UNIT/ML IV SOLN: 40.0000 [IU] | Freq: Once | INTRAVENOUS | Status: DC

## 2020-01-02 MED ORDER — EPINEPHRINE HCL 5 MG/250ML IV SOLN IN NS
0.5000 ug/min | INTRAVENOUS | Status: DC
  Administered 2020-01-02: 20 ug/min via INTRAVENOUS
  Administered 2020-01-02: 5 ug/min via INTRAVENOUS
  Filled 2020-01-02 (×3): qty 250

## 2020-01-02 MED ORDER — LACTATED RINGERS IV BOLUS
1000.0000 mL | Freq: Once | INTRAVENOUS | Status: AC
  Administered 2020-01-02: 1000 mL via INTRAVENOUS

## 2020-01-02 MED ORDER — ORAL CARE MOUTH RINSE
15.0000 mL | Freq: Two times a day (BID) | OROMUCOSAL | Status: DC
  Administered 2020-01-02 (×2): 15 mL via OROMUCOSAL

## 2020-01-02 NOTE — Progress Notes (Addendum)
PCCM Overnight  Spoke with [facility] Program Coordination at 21:06 She was very knowledgeable regarding patient's baseline  She states that he has been with them for about 15-18 months  His only medications are Allopurinol 100 mg BID and Calcitriol 0.25 mg. At baseline he is responsive and interactive walks a few steps but requires assistance with a wheel chair at times due to a history of drops/falls.  Over the last few month he has had decreased PO intake and has required assisted feedings. They have tried to supplement his meals with Boost.   Most recently he has decreased urine output and for the last 2 days prior to admission he had none. He was also noted to be constipated and received some magnesium based laxative which cause him to have diarrhea. Prior to patient coming into [facility] he had a fall from his chair where he hit his head which explains the abrasion noted over his forehead.   His legal guardian is his brother Peyton Najjar [phone]   Spoke to his brother at 2110 He states that his brother has had a progressive decline and his current baseline is poor. He is not able to understand and follow simple commands and has had more falls in recent months.  We spoke about current interventions, clinical status, prognosis and possible interventions. Peyton Najjar was very clear, he did not want his brother to receive a CT Head as he does not feel it will change the current situation much.  He does not want him to received CPR, defibrillations, intubation or any escalation of care at this time.  The patient's resuscitation status was changed to DNR/ DNI Peyton Najjar would like [place] to continue with antibiotics and current measures and should their be a chance that he improves but if he continues to decline he would like [place] to be transitioned to comfort care.  Plan: 1. Continue with current measures including vasopressors, supplemental O2, antibiotics. No intubation, No code blue, No CPR no aggressive measures. 2.  Given his poor PO intake will add thiamine and folic to regimen.  3. Has a h/o falls and a recent abrasion on forehead, confirmed by NH that pt had a fall where he hit his head- concern for an acute intracranial process however Legal Guardian does not want CTH w/o contrast.  Also given recent  desaturations will not proceed with scan. 4. Episodes of desaturation. Good waveform on O2 sat. Has patchy opacities at lung bases concerning for pneumonia vs atelectasis. Has been on multiple IV medications with decreased UOP: Pneumonia complicated by Pulmonary edema--> ABG: 7.38/36/92/21 Shows improved metabolic acidosis with some respiratory alkalosis. Pt continues on Bicarb gtt at 100cc/hr. Stopped LR @ 75cc/hr.  MAP is improved (pt's Baseline BP per [facility] SBP 110-90 mmHg) will consider diuresis to see if it improves O2 sat.   Signed Dr Newell Coral [facility] Critical [facility] Locums

## 2020-01-02 NOTE — Progress Notes (Signed)
Pt nt suctioned for scant amount pink tinged thin secretion left nostril by charge nurse Leighton Parody, pt with desat to 77 % with nt suction, NRB replaced with o2 sat returned to 88%.

## 2020-01-02 NOTE — Progress Notes (Signed)
Dr Carlota Raspberry to pt bedside. CVP reading 1, pt w/nonproductive cough. Will continue to reposition pt highfowler position as well as nt suction as needed.

## 2020-01-02 NOTE — Progress Notes (Signed)
RT informed Dr Carlota Raspberry of pt o2 sats 88% on NRB, bipap deferred for now per md. Md explains pt in fluid overload, will  give lasix and albumin as per md order This rn informed Dr Carlota Raspberry of pt cbg 202, will continue d10 infusion per md.

## 2020-01-02 NOTE — Progress Notes (Signed)
Group home is called [facility] and Clarisse Gouge is the Tree surgeon at this time. Answered all adminssion questions and history. Contact information is office number is [phone] and cell phone [phone].

## 2020-01-02 NOTE — ED Notes (Signed)
Hanging norepinepherine and levo stopped. Epi at 5.

## 2020-01-02 NOTE — ED Notes (Signed)
Map low ref PA moved Levo to 13, then to 15 then 2 16

## 2020-01-02 NOTE — Progress Notes (Signed)
  Echocardiogram 2D Echocardiogram has been performed.  Celene Skeen 01/02/2020, 3:03 PM

## 2020-01-02 NOTE — Plan of Care (Signed)
  Problem: Health Behavior/Discharge Planning: Goal: Ability to manage health-related needs will improve Outcome: Progressing   Problem: Clinical Measurements: Goal: Ability to maintain clinical measurements within normal limits will improve Outcome: Progressing Goal: Will remain free from infection Outcome: Progressing Goal: Diagnostic test results will improve Outcome: Progressing Goal: Respiratory complications will improve Outcome: Progressing Goal: Cardiovascular complication will be avoided Outcome: Progressing   Problem: Education: Goal: Knowledge of General Education information will improve Description: Including pain rating scale, medication(s)/side effects and non-pharmacologic comfort measures Outcome: Progressing   Problem: Activity: Goal: Risk for activity intolerance will decrease Outcome: Progressing   Problem: Nutrition: Goal: Adequate nutrition will be maintained Outcome: Progressing   Problem: Coping: Goal: Level of anxiety will decrease Outcome: Progressing   Problem: Elimination: Goal: Will not experience complications related to bowel motility Outcome: Progressing Goal: Will not experience complications related to urinary retention Outcome: Progressing   Problem: Pain Managment: Goal: General experience of comfort will improve Outcome: Progressing   Problem: Skin Integrity: Goal: Risk for impaired skin integrity will decrease Outcome: Progressing

## 2020-01-02 NOTE — ED Notes (Signed)
Added 2nd bag of fluid, map slightly coming up on art line to 35

## 2020-01-02 NOTE — Progress Notes (Signed)
Co-ox sample was ran on Arterial blood so second sample was sent to redo lab work on venous blood.

## 2020-01-02 NOTE — Procedures (Signed)
Central Venous Catheter Insertion Procedure Note Richard Greene [id number] 1956/05/23  Procedure: Insertion of Central Venous Catheter Indications: Assessment of intravascular volume, Drug and/or fluid administration and Frequent blood sampling  Procedure Details Consent: Unable to obtain consent because of emergent medical necessity. Time Out: Verified patient identification, verified procedure, site/side was marked, verified correct patient position, special equipment/implants available, medications/allergies/relevent history reviewed, required imaging and test results available.  Performed  Maximum sterile technique was used including antiseptics, cap, gloves, gown, hand hygiene, mask and sheet. Skin prep: Chlorhexidine; local anesthetic administered A antimicrobial bonded/coated triple lumen catheter was placed in the right internal jugular vein using the Seldinger technique.  Evaluation Blood flow good Complications: No apparent complications Patient did tolerate procedure well. Chest X-ray ordered to verify placement.  CXR: pending.  Richard Greene Richard Greene 01/02/2020, 3:15 AM

## 2020-01-02 NOTE — Progress Notes (Addendum)
NAME:  Richard Greene, MRN:  [id number], DOB:  October 08, 1956, LOS: 1 ADMISSION DATE:  01-22-2020, CONSULTATION DATE:  01/02/20 REFERRING MD: Broadus John , CHIEF COMPLAINT:  sepsis   Brief History   64 y.o. M who resides in a group home with PMH of Down's Syndrome, Gout, adrenal insufficiency not on steroids, HL, frequent constipation who presents with diarrhea after taking laxatives for constipation.  Had a syncopal episode the day before admission and then has been lethargic and hypotensive so brought to the ED where patient was requiring Levophed for suspected sepsis, so PCCM consulted for admission.  History of present illness   Richard Greene is a 64 y.o.M with PMH as above who presents with lethargy, bradycardia, hypotension, diarrhea after using laxatives and hypothermia.   In the ED, BP was as low as 58/50, given 3 L IVF and started on levophed, T 86.50F and HR 50's.   Labs were significant for WBC of 3.8, creatinine 1.8, platelets 58, glucose 57, Potassium 6.5.  He was placed on Bair hugger, given 1.5L IVF, Solucortef 50mg , Vancomycin, Flagyl and Cefepime, insulin and D50, started on Levophed which was stopped and re-started as BP very labile.  CXR without infiltrate.   Past Medical History   has a past medical history of Constipation, Down syndrome, Dyspepsia, Gout, unspecified, Hypokalemia (07/2013), Obesity, unspecified, Osteoarthrosis, unspecified whether generalized or localized, unspecified site, and Pure hypercholesterolemia.  Significant Hospital Events   1/2 admit to PCCM  Consults:  N/A as of 1/3  Procedures:  1/2 right radial A-line 1/3 CVC right IJ  Significant Diagnostic Tests:  1/2 CXR>>Chronic elevation of the left diaphragm with probable hazyatelectasis at the left base. 1/2 CT chest/abdomen/pelvis>> Extensive periportal edema and gallbladder wall thickening, periportal edema may be secondary to the patient's volume status or be related to acute hepatitis, There is a developing  airspace opacity at the left lung base, concerning for pneumonia, moderate to severe rectal wall thickening 1/3 ABD US-Limited with mild gallbladder distention and wall thickening w/ no gallstones or sonographic Murphy sign noted. CBD 84mm, consider HIDA scan to evaluate for cystic and CBD patency with MRCP if patient able to tolerate breath hold technique.   Micro Data:  1/2 Covid-19 and influenza>> negative 1/2 RVP-Negative for influenza  1/2 blood cultures x2>> 1/2 urine culture>> 1/3 sputum culture>>  Antimicrobials:  Vancomycin 1/2->> Flagyl 1/2->> Cefepime 1/2->>  Interim history/subjective:  Patient was given 3 L of IV fluid and 150 mg hydrocortisone in the emergency department along with broad spectrum antibiotics  Objective   Blood pressure (!) 218/204, pulse 94, temperature (!) 96.6 F (35.9 C), resp. rate (!) 21, height 5\' 2"  (1.575 m), weight 53.5 kg, SpO2 99 %.        Intake/Output Summary (Last 24 hours) at 01/02/2020 0709 Last data filed at 01/02/2020 0618 Gross per 24 hour  Intake 1553.07 ml  Output 105 ml  Net 1448.07 ml   Filed Weights   01-22-20 1851 01/02/20 0348  Weight: 49.9 kg 53.5 kg   General: Lethargic, minimally responsive to voice, does not open eye but moans in response. Afebrile, nondiaphoretic HEENT: PERRL Cardio: RRR, no mrg's regular and tachy Pulmonary: Diffuse rhonchus breathing with increasing intensity in the lower lung fields bilaterally  Abdomen: Bowel sounds normal, soft, nontender  MSK: BLE nontender, nonedematous  Resolved Hospital Problem list   None  Assessment & Plan:   Septic shock: Improving. BP, HR and temp returning to normal limits.  Hypotension, lethargy and bradycardia  were likely secondary to concurrent sepsis physiology/shock and possible physiologic stress induced adrenal insufficiency. Etiology uncertain but aspiration pneumonia vs stercoral colitis certainly possible. Increased lactate today to >7 despite  improvement in BP . P: -Continue to try and wean pressors as patient improves -Patient received 30 cc/kg IV fluids in the ED. Continuing maintenance fluids and pressors PRN, now on Epi at 87mcg/min, Vasopressin 0.03 U/min and angiotensin II being uptitrated as indicated, MAP stable ~85 -C. Difficile needs collected -Blood and urine cultures pending -Continue broad-spectrum antibiotics with Vanco, cefepime, Flagyl until nidus of infection more clear and cultures result -Cortisol level >100, given bolus of 150 mg hydrocortisone, will taper stress dose steroids from 50 mg every 6hrs to q8  Metabolic acidosis: Bicarb 13.6, pH 7.264. Likely 2/2 to lactic acid. Etiology uncertain as perfusion likely improved given improved vitals. P: Will trend lactate  BMP to calculate GAP Continue pressors  Periportal edema with gallbladder wall thickening -Very mildly elevated LFTs with normal bilirubin. RUQ US unremarkable. Doubt biliary process and likely unable to complete MRCP due to patients status. May consult GI if other source not found. P: -Continue broad spectrum antibiotics  Hyperkalemia Now resolved. Likely secondary to renal injury. P: Trend daily Trend creatinine after IV fluids, Place Foley catheter  Acute kidney injury -Likely secondary to sepsis and volume depletion P: -Good urine output, renal function, continue maintenance fluids and avoid nephrotoxins  Thrombocytopenia Platelets 58 initially, improving to 69 Suspect secondary to critical illness, possible gram-negative sepsis? P: -Hold prophylactic heparin until count above 100, continue SCDs for DVT prophylaxis -Repeat CBC in the morning transfuse for platelets less than 10 K or neurological change  Best practice:  Diet: N.p.o. Pain/Anxiety/Delirium protocol (if indicated): n/a VAP protocol (if indicated): N/AA DVT prophylaxis: SCD's GI prophylaxis: protonix Glucose control: ssi Mobility: bed rest Code Status:  full Family Communication: will attempt to reach brother Disposition: ICU  Labs   CBC: Recent Labs  Lab 01-20-2020 1928 20-Jan-2020 2351 01/02/20 0433 01/02/20 0459  WBC 3.8*  --  5.9  --   NEUTROABS 2.6  --   --   --   HGB 10.6* 10.2* 9.4* 8.5*  HCT 31.7* 30.0* 27.6* 25.0*  MCV 98.8  --  99.6  --   PLT 58*  --  69*  --     Basic Metabolic Panel: Recent Labs  Lab January 20, 2020 1928 01-20-20 2238 Jan 20, 2020 2351 01/02/20 0459  NA 139  --  141 143  K 6.5* 6.3* 5.7* 4.3  CL 108  --   --   --   CO2 22  --   --   --   GLUCOSE 67*  --   --   --   BUN 64*  --   --   --   CREATININE 1.97* 1.80*  --   --   CALCIUM 8.6*  --   --   --    GFR: Estimated Creatinine Clearance: 32.2 mL/min (A) (by C-G formula based on SCr of 1.8 mg/dL (H)). Recent Labs  Lab 01-20-2020 1928 20-Jan-2020 1933 01/02/20 0433  WBC 3.8*  --  5.9  LATICACIDVEN  --  1.3 7.2*   Liver Function Tests: Recent Labs  Lab 01/20/20 1928  AST 74*  ALT 88*  ALKPHOS 61  BILITOT 0.7  PROT 5.3*  ALBUMIN 2.6*   No results for input(s): LIPASE, AMYLASE in the last 168 hours. No results for input(s): AMMONIA in the last 168 hours.  ABG  Component Value Date/Time   PHART 7.264 (L) 01/02/2020 0459   PCO2ART 28.7 (L) 01/02/2020 0459   PO2ART 91.0 01/02/2020 0459   HCO3 13.4 (L) 01/02/2020 0459   TCO2 14 (L) 01/02/2020 0459   ACIDBASEDEF 13.0 (H) 01/02/2020 0459   O2SAT 97.0 01/02/2020 0459     Coagulation Profile: Recent Labs  Lab 01/29/2020 1928  INR 1.3*    Cardiac Enzymes: No results for input(s): CKTOTAL, CKMB, CKMBINDEX, TROPONINI in the last 168 hours.  HbA1C: No results found for: HGBA1C  CBG: Recent Labs  Lab 01/24/2020 1842 01/12/2020 1932 01/19/2020 2028 01/02/20 0045 01/02/20 0410  GLUCAP 66* 57* 243* 176* 88    Review of Systems:   Unable to obtain secondary to altered mental status  Past Medical History  He,  has a past medical history of Constipation, Down syndrome, Dyspepsia, Gout,  unspecified, Hypokalemia (07/2013), Obesity, unspecified, Osteoarthrosis, unspecified whether generalized or localized, unspecified site, and Pure hypercholesterolemia.   Surgical History    Past Surgical History:  Procedure Laterality Date  . NO PAST SURGERIES       Social History   reports that he has never smoked. He has never used smokeless tobacco. He reports that he does not drink alcohol or use drugs.   Family History   His family history is not on file.   Allergies No Known Allergies   Home Medications  Prior to Admission medications   Medication Sig Start Date End Date Taking? Authorizing Provider  allopurinol (ZYLOPRIM) 100 MG tablet Take 100 mg by mouth daily. 11/24/18   [provider]  loperamide (IMODIUM) 2 MG capsule Take 1 capsule (2 mg total) by mouth 4 (four) times daily as needed for diarrhea or loose stools. 08/06/13   Delfina Redwood, MD  risperiDONE (RISPERDAL) 0.5 MG tablet Take 0.5 mg by mouth 2 (two) times daily. 11/15/18   [provider]     Critical care time: 65 minutes    CRITICAL CARE Performed by: Eustaquio Maize, MD [facility], PGY-3 Pager # [phone]  Please see Attending A/P and/or Addendum for final recommendations:

## 2020-01-03 DIAGNOSIS — Z515 Encounter for palliative care: Secondary | ICD-10-CM

## 2020-01-03 DIAGNOSIS — Z7189 Other specified counseling: Secondary | ICD-10-CM

## 2020-01-03 LAB — BASIC METABOLIC PANEL
Anion gap: 10 (ref 5–15)
BUN: 45 mg/dL — ABNORMAL HIGH (ref 8–23)
CO2: 24 mmol/L (ref 22–32)
Calcium: 6.8 mg/dL — ABNORMAL LOW (ref 8.9–10.3)
Chloride: 98 mmol/L (ref 98–111)
Creatinine, Ser: 2.01 mg/dL — ABNORMAL HIGH (ref 0.61–1.24)
GFR calc Af Amer: 40 mL/min — ABNORMAL LOW (ref >60)
GFR calc non Af Amer: 35 mL/min — ABNORMAL LOW (ref >60)
Glucose, Bld: 357 mg/dL — ABNORMAL HIGH (ref 70–99)
Potassium: 4.3 mmol/L (ref 3.5–5.1)
Sodium: 132 mmol/L — ABNORMAL LOW (ref 135–145)

## 2020-01-03 LAB — CBC
HCT: 26.4 % — ABNORMAL LOW (ref 39.0–52.0)
Hemoglobin: 9.2 g/dL — ABNORMAL LOW (ref 13.0–17.0)
MCH: 33.6 pg (ref 26.0–34.0)
MCHC: 34.8 g/dL (ref 30.0–36.0)
MCV: 96.4 fL (ref 80.0–100.0)
Platelets: 30 10*3/uL — ABNORMAL LOW (ref 150–400)
RBC: 2.74 MIL/uL — ABNORMAL LOW (ref 4.22–5.81)
RDW: 15.3 % (ref 11.5–15.5)
WBC: 1.2 10*3/uL — CL (ref 4.0–10.5)
nRBC: 1.7 % — ABNORMAL HIGH (ref 0.0–0.2)

## 2020-01-03 LAB — GLUCOSE, CAPILLARY
Glucose-Capillary: 48 mg/dL — ABNORMAL LOW (ref 70–99)
Glucose-Capillary: 88 mg/dL (ref 70–99)

## 2020-01-03 MED ORDER — MORPHINE 100MG IN NS 100ML (1MG/ML) PREMIX INFUSION
1.0000 mg/h | INTRAVENOUS | Status: DC
  Administered 2020-01-03: 1 mg/h via INTRAVENOUS
  Filled 2020-01-03: qty 100

## 2020-01-03 MED ORDER — DEXTROSE 5 % IV SOLN: INTRAVENOUS | Status: DC

## 2020-01-03 MED ORDER — LORAZEPAM 2 MG/ML IJ SOLN: 1.0000 mg | INTRAMUSCULAR | Status: DC | PRN

## 2020-01-03 MED ORDER — MORPHINE SULFATE (PF) 2 MG/ML IV SOLN
2.0000 mg | INTRAVENOUS | Status: DC | PRN
  Administered 2020-01-03: 2 mg via INTRAVENOUS
  Filled 2020-01-03: qty 1

## 2020-01-03 MED ORDER — LORAZEPAM 2 MG/ML IJ SOLN
0.5000 mg | Freq: Once | INTRAMUSCULAR | Status: AC
  Administered 2020-01-03: 0.5 mg via INTRAVENOUS

## 2020-01-03 MED ORDER — MORPHINE SULFATE (PF) 4 MG/ML IV SOLN
INTRAVENOUS | Status: AC
  Filled 2020-01-03: qty 1

## 2020-01-03 MED ORDER — GLYCOPYRROLATE 0.2 MG/ML IJ SOLN: 0.2000 mg | INTRAMUSCULAR | Status: DC | PRN

## 2020-01-03 MED ORDER — DEXTROSE 50 % IV SOLN
INTRAVENOUS | Status: AC
  Administered 2020-01-03: 50 mL
  Filled 2020-01-03: qty 50

## 2020-01-03 MED ORDER — CHLORHEXIDINE GLUCONATE CLOTH 2 % EX PADS: 6.0000 | MEDICATED_PAD | Freq: Every day | CUTANEOUS | Status: DC

## 2020-01-03 MED ORDER — POLYVINYL ALCOHOL 1.4 % OP SOLN: 1.0000 [drp] | Freq: Four times a day (QID) | OPHTHALMIC | Status: DC | PRN

## 2020-01-03 MED ORDER — MORPHINE SULFATE (PF) 2 MG/ML IV SOLN
2.0000 mg | INTRAVENOUS | Status: AC
  Administered 2020-01-03 (×2): 2 mg via INTRAVENOUS

## 2020-01-03 MED ORDER — LORAZEPAM 2 MG/ML IJ SOLN
INTRAMUSCULAR | Status: AC
  Filled 2020-01-03: qty 1

## 2020-01-03 MED ORDER — GLYCOPYRROLATE 1 MG PO TABS: 1.0000 mg | ORAL_TABLET | ORAL | Status: DC | PRN

## 2020-01-03 MED ORDER — MORPHINE 100MG IN NS 100ML (1MG/ML) PREMIX INFUSION: 0.0000 mg/h | INTRAVENOUS | Status: DC

## 2020-01-03 MED ORDER — MORPHINE BOLUS VIA INFUSION
5.0000 mg | INTRAVENOUS | Status: DC | PRN
  Filled 2020-01-03: qty 5

## 2020-01-03 MED ORDER — DIPHENHYDRAMINE HCL 50 MG/ML IJ SOLN: 25.0000 mg | INTRAMUSCULAR | Status: DC | PRN

## 2020-01-03 MED ORDER — ACETAMINOPHEN 650 MG RE SUPP: 650.0000 mg | Freq: Four times a day (QID) | RECTAL | Status: DC | PRN

## 2020-01-03 MED ORDER — MORPHINE SULFATE (PF) 2 MG/ML IV SOLN: 2.0000 mg | INTRAVENOUS | Status: DC | PRN

## 2020-01-03 MED ORDER — ACETAMINOPHEN 325 MG PO TABS: 650.0000 mg | ORAL_TABLET | Freq: Four times a day (QID) | ORAL | Status: DC | PRN

## 2020-01-04 LAB — URINE CULTURE: Culture: 80000 — AB

## 2020-01-06 LAB — CULTURE, BLOOD (ROUTINE X 2)
Culture: NO GROWTH
Culture: NO GROWTH

## 2020-01-07 LAB — STOOL CULTURE REFLEX - CMPCXR

## 2020-01-07 LAB — STOOL CULTURE: E coli, Shiga toxin Assay: NEGATIVE

## 2020-01-07 LAB — STOOL CULTURE REFLEX - RSASHR

## 2020-01-31 NOTE — Progress Notes (Signed)
Spoke with brother larry over the phone.  He is interested more in comfort at this point.  He understands that we will stop pressors and increase morphine for comfort.  On exam, he continues to struggle to breath with coarse BS diffusely.  I reviewed CXR myself, infiltrate noted.  Discussed with bedside RN and with brother.  Will stop pressors.  Transition from [facility] to [facility].  Increase morphine for comfort and d/c all other medications.  Transition to comfort.  Code status confirmed.  The patient is critically ill with multiple organ systems failure and requires high complexity decision making for assessment and support, frequent evaluation and titration of therapies, application of advanced monitoring technologies and extensive interpretation of multiple databases.   Critical Care Time devoted to patient care services described in this note is  31  Minutes. This time reflects time of care of this signee Dr Koren Bound. This critical care time does not reflect procedure time, or teaching time or supervisory time of PA/NP/Med student/Med Resident etc but could involve care discussion time.  [name], M.D. [facility] Pulmonary/Critical Care Medicine.

## 2020-01-31 NOTE — Progress Notes (Signed)
This rn called and spoke to pt brother Peyton Najjar to offer support and answer questions as needed. Informed Peyton Najjar of pt current vital signs, morphine infusion and orders to discontinue pressor related medications. Peyton Najjar express thankfulness for the care being provided to his brother. Peyton Najjar denies having any questions at this time and ask that the staff notify him as needed for changes in pt condition.

## 2020-01-31 NOTE — Progress Notes (Signed)
Called elink, informed md pt now restless and with labored rr, oxygen sat 77%. Will give morphine ivp and dc sodium bicarb drip per md verbal order.

## 2020-01-31 NOTE — Progress Notes (Signed)
eLink Physician-Brief Progress Note Patient Name: SAHAJ BONA DOB: 1956-02-10 MRN: [id number]   Date of Service  01/25/2020  HPI/Events of Note  RN requests CBG checks Q4H order since on D10.  eICU Interventions  Order entered.     Intervention Category Minor Interventions: Routine modifications to care plan (e.g. PRN medications for pain, fever)  Marveen Reeks Cecylia Brazill 01/22/2020, 5:02 AM

## 2020-01-31 NOTE — Progress Notes (Signed)
Pt expired at 0859, asystole on monitor. Breathe and heart sounds auscultated, and pt pronounced by myself and Ardith Dark, RN. Dr Claudette Head, brother Peyton Najjar, and Bridgette director from pt's group home were all notified.

## 2020-01-31 NOTE — Progress Notes (Signed)
eLink Physician-Brief Progress Note Patient Name: Richard Greene DOB: 05-Feb-1956 MRN: [id number]   Date of Service  01-11-2020  HPI/Events of Note  Patient in increasing respiratory distress. Sats in high 60s on [place] + NRB.  Dr. Carlota Raspberry spoke to patient's surrogate decision maker (see separate note by that MD) and they were agreeable to transitioning the patient to comfort measures.  eICU Interventions  DNR + CMO order placed.  Morphine and ativan orders placed.     Intervention Category Minor Interventions: Communication with other healthcare providers and/or family  Richard Greene 01-11-20, 6:32 AM

## 2020-01-31 NOTE — Consult Note (Signed)
Responded to page, pt on comfort care, no family to come. Nurse asked chaplain to speak encouraging words for transition. I blessed him for the journey and did.  Rev Donnel Saxon  Chaplain

## 2020-01-31 NOTE — Significant Event (Signed)
PCCM Overnight   Despite interventions including continued antibiotics, attempting diuresis and supplemental O2 pt continues to decline, increased work of breathing and resp distress. Spoke to [employer] on phone and confirmed code status DNR DNI 6:24 AM spoke with Peyton Najjar regarding current clinical status and he does not want his brother to suffer. He understands that it is unlikely that Darrin will survive without these aggressive critical care interventions. He is ok with converting plan to comfort care measures only. He is ok with his brother receiving morphine for respiratory distress and air hunger.  Peyton Najjar has been given the phone number to 2M ICU and the patient's room number and he is available on [phone] When asked if he would like a Chaplain he declined. He did not indicate that he would be coming to the bedside. He is ok with [place] proceeding.  Signed Dr Newell Coral Pulmonary Critical Care Locums

## 2020-01-31 NOTE — Death Summary Note (Signed)
DEATH SUMMARY   Patient Details  Name: Richard Greene MRN: [id number] DOB: 1956/12/26  Admission/Discharge Information   Admit Date:  01/02/2020  Date of Death: Date of Death: 15-Nov-2020  Time of Death: Time of Death: 0859  Length of Stay: 2  Referring Physician: Marletta LorBarr, Julie, NP   Reason(s) for Hospitalization  Acute respiratory failure  Diagnoses  Preliminary cause of death:   Acute hypoxemic respiratory failure  Secondary Diagnoses (including complications and co-morbidities):  Active Problems:   Colitis   Sepsis (HCC)   Pneumonia of left lower lobe due to infectious organism   Septic shock (HCC)   Thickening of wall of gallbladder   Accidental fall from chair   Poor nutrition Palliative care encounter Goals of care discussion  Brief Hospital Course (including significant findings, care, treatment, and services provided and events leading to death)  64 y.o. M who resides in a group home with PMH of Down's Syndrome, Gout, adrenal insufficiency not on steroids, HL, frequent constipation who presents with diarrhea after taking laxatives for constipation.  Had a syncopal episode the day before admission and then has been lethargic and hypotensive so brought to the ED where patient was requiring Levophed for suspected sepsis, so PCCM consulted for admission.  Spoke with brother larry over the phone.  He is interested more in comfort at this point.  He understands that we will stop pressors and increase morphine for comfort.  On exam, he continues to struggle to breath with coarse BS diffusely.  I reviewed CXR myself, infiltrate noted.  Discussed with bedside RN and with brother.  Will stop pressors.  [facility]  Increase morphine for comfort and d/c all other medications.  Transition to comfort.  Code status confirmed.    Pertinent Labs and Studies  Significant Diagnostic Studies CT Abdomen Pelvis Wo Contrast  Result Date: 01/21/2020 CLINICAL DATA:  Hypotension  and bradycardia. EXAM: CT CHEST, ABDOMEN AND PELVIS WITHOUT CONTRAST TECHNIQUE: Multidetector CT imaging of the chest, abdomen and pelvis was performed following the standard protocol without IV contrast. COMPARISON:  None. FINDINGS: CT CHEST FINDINGS Cardiovascular: The heart size is normal. Aortic calcifications are noted. There is no thoracic aortic aneurysm. The intracardiac blood pool is hypodense relative to the adjacent myocardium consistent with anemia. Mediastinum/Nodes: --No mediastinal or hilar lymphadenopathy. --No axillary lymphadenopathy. --No supraclavicular lymphadenopathy. --Normal thyroid gland. --The esophagus is unremarkable Lungs/Pleura: Mild emphysematous changes are noted bilaterally. There is scattered bilateral micro nodules, perhaps most evident in the upper lobes and at the lung bases bilaterally. There is a developing airspace opacity at the left lung base. There is no pneumothorax. There is no significant pleural effusion. Musculoskeletal: Advanced disc height loss is noted at the T10-T11 and T9-T10 levels. There is no obvious displaced fracture. CT ABDOMEN PELVIS FINDINGS Hepatobiliary: Extensive periportal edema is noted. There is gallbladder wall thickening.The biliary tree is not well evaluated secondary to lack of IV contrast and streak artifact through the patient's abdomen. Pancreas: Normal contours without ductal dilatation. No peripancreatic fluid collection. Spleen: No splenic laceration or hematoma. Adrenals/Urinary Tract: --Adrenal glands: No adrenal hemorrhage. --Right kidney/ureter: No hydronephrosis or perinephric hematoma. --Left kidney/ureter: There is a cyst in the interpolar region of the left kidney. --Urinary bladder: The bladder is decompressed with a Foley catheter. Stomach/Bowel: --Stomach/Duodenum: No hiatal hernia or other gastric abnormality. Normal duodenal course and caliber. --Small bowel: No dilatation or inflammation. --Colon: There is moderate to severe  rectal wall thickening. There is a large amount  of stool in the patient's rectum. --Appendix: Normal. Vascular/Lymphatic: Normal course and caliber of the major abdominal vessels. --No retroperitoneal lymphadenopathy. --No mesenteric lymphadenopathy. --No pelvic or inguinal lymphadenopathy. Reproductive: Unremarkable Other: There is a small volume of free fluid in the patient's abdomen. There is no free air. There is body wall edema. Musculoskeletal. No acute displaced fractures. Advanced multilevel disc degenerative changes are noted involving the lumbar spine. IMPRESSION: 1. Extensive periportal edema and gallbladder wall thickening. The biliary tree is not well evaluated secondary to lack of IV contrast and streak artifact through the patient's abdomen. If there is concern for acute cholecystitis, a right upper quadrant ultrasound may be of benefit. The periportal edema may be secondary to the patient's volume status or be related to acute hepatitis. 2. There is a developing airspace opacity at the left lung base, concerning for pneumonia. 3. There is moderate to severe rectal wall thickening with a large amount of stool in the patient's rectum. This could be secondary to an infectious or inflammatory process or stercoral colitis. 4. Scattered micro nodules throughout the lungs bilaterally of unknown clinical significance, favored to represent infectious or inflammatory process. Consider a three-month follow-up CT to confirm stability or resolution of this finding. 5. There is a small volume of free fluid in the patient's abdomen and pelvis. 6. Anemia. 7. Mild body wall edema. Aortic Atherosclerosis (ICD10-I70.0) and Emphysema (ICD10-J43.9). Electronically Signed   By: Constance Holster M.D.   On: 01/24/2020 22:38   CT Chest Wo Contrast  Result Date: 01/12/2020 CLINICAL DATA:  Hypotension and bradycardia. EXAM: CT CHEST, ABDOMEN AND PELVIS WITHOUT CONTRAST TECHNIQUE: Multidetector CT imaging of the chest,  abdomen and pelvis was performed following the standard protocol without IV contrast. COMPARISON:  None. FINDINGS: CT CHEST FINDINGS Cardiovascular: The heart size is normal. Aortic calcifications are noted. There is no thoracic aortic aneurysm. The intracardiac blood pool is hypodense relative to the adjacent myocardium consistent with anemia. Mediastinum/Nodes: --No mediastinal or hilar lymphadenopathy. --No axillary lymphadenopathy. --No supraclavicular lymphadenopathy. --Normal thyroid gland. --The esophagus is unremarkable Lungs/Pleura: Mild emphysematous changes are noted bilaterally. There is scattered bilateral micro nodules, perhaps most evident in the upper lobes and at the lung bases bilaterally. There is a developing airspace opacity at the left lung base. There is no pneumothorax. There is no significant pleural effusion. Musculoskeletal: Advanced disc height loss is noted at the T10-T11 and T9-T10 levels. There is no obvious displaced fracture. CT ABDOMEN PELVIS FINDINGS Hepatobiliary: Extensive periportal edema is noted. There is gallbladder wall thickening.The biliary tree is not well evaluated secondary to lack of IV contrast and streak artifact through the patient's abdomen. Pancreas: Normal contours without ductal dilatation. No peripancreatic fluid collection. Spleen: No splenic laceration or hematoma. Adrenals/Urinary Tract: --Adrenal glands: No adrenal hemorrhage. --Right kidney/ureter: No hydronephrosis or perinephric hematoma. --Left kidney/ureter: There is a cyst in the interpolar region of the left kidney. --Urinary bladder: The bladder is decompressed with a Foley catheter. Stomach/Bowel: --Stomach/Duodenum: No hiatal hernia or other gastric abnormality. Normal duodenal course and caliber. --Small bowel: No dilatation or inflammation. --Colon: There is moderate to severe rectal wall thickening. There is a large amount of stool in the patient's rectum. --Appendix: Normal.  Vascular/Lymphatic: Normal course and caliber of the major abdominal vessels. --No retroperitoneal lymphadenopathy. --No mesenteric lymphadenopathy. --No pelvic or inguinal lymphadenopathy. Reproductive: Unremarkable Other: There is a small volume of free fluid in the patient's abdomen. There is no free air. There is body wall edema. Musculoskeletal. No acute displaced  fractures. Advanced multilevel disc degenerative changes are noted involving the lumbar spine. IMPRESSION: 1. Extensive periportal edema and gallbladder wall thickening. The biliary tree is not well evaluated secondary to lack of IV contrast and streak artifact through the patient's abdomen. If there is concern for acute cholecystitis, a right upper quadrant ultrasound may be of benefit. The periportal edema may be secondary to the patient's volume status or be related to acute hepatitis. 2. There is a developing airspace opacity at the left lung base, concerning for pneumonia. 3. There is moderate to severe rectal wall thickening with a large amount of stool in the patient's rectum. This could be secondary to an infectious or inflammatory process or stercoral colitis. 4. Scattered micro nodules throughout the lungs bilaterally of unknown clinical significance, favored to represent infectious or inflammatory process. Consider a three-month follow-up CT to confirm stability or resolution of this finding. 5. There is a small volume of free fluid in the patient's abdomen and pelvis. 6. Anemia. 7. Mild body wall edema. Aortic Atherosclerosis (ICD10-I70.0) and Emphysema (ICD10-J43.9). Electronically Signed   By: Katherine Mantle M.D.   On: 01/28/2020 22:38   DG Chest Portable 1 View  Result Date: 01/02/2020 CLINICAL DATA:  Central line placement. EXAM: PORTABLE CHEST 1 VIEW COMPARISON:  Radiograph and CT yesterday FINDINGS: Right internal jugular central line tip in the atrial caval junction. No pneumothorax. Heart is normal in size. Elevated left  hemidiaphragm as before. Developing patchy opacities at both lung bases. Micro nodularity on CT not well demonstrated radiographically. No significant pleural fluid. IMPRESSION: 1. Right internal jugular central line tip in the atrial caval junction. No pneumothorax. 2. Developing patchy opacities at both lung bases, may be atelectasis or pneumonia. Electronically Signed   By: Narda Rutherford M.D.   On: 01/02/2020 03:14   DG Chest Port 1 View  Result Date: 01/13/2020 CLINICAL DATA:  Sepsis EXAM: PORTABLE CHEST 1 VIEW COMPARISON:  03/15/2015 FINDINGS: Chronic elevation of left diaphragm. No consolidation or effusion. Probable hazy atelectasis left base. Normal heart size. No pneumothorax. IMPRESSION: Chronic elevation of the left diaphragm with probable hazy atelectasis at the left base. No focal pulmonary infiltrate. Electronically Signed   By: Jasmine Pang M.D.   On: 01/09/2020 19:48   ECHOCARDIOGRAM COMPLETE  Result Date: 01/02/2020   ECHOCARDIOGRAM REPORT   Patient Name:   RACER QUAM Date of Exam: 01/02/2020 Medical Rec #:  [id number]    Height:       62.0 in Accession #:    [id number]   Weight:       117.9 lb Date of Birth:  04-23-1956   BSA:          1.53 m Patient Age:    63 years     BP:           210/196 mmHg Patient Gender: M            HR:           93 bpm. Exam Location:  Inpatient Procedure: 2D Echo Indications:    syncope  History:        Patient has prior history of Echocardiogram examinations, most                 recent 07/31/2013. Risk Factors:Dyslipidemia. Down syndrome.  Sonographer:    Celene Skeen RDCS (AE) Referring Phys: [id number] Gypsy Balsam SCATLIFFE  Sonographer Comments: restricted mobility IMPRESSIONS  1. Left ventricular ejection fraction, by visual estimation, is 70 to 75%. The  left ventricle has hyperdynamic function. There is no left ventricular hypertrophy.  2. Left ventricular diastolic parameters are consistent with Grade I diastolic dysfunction (impaired relaxation).  3. The  left ventricle has no regional wall motion abnormalities.  4. Global right ventricle has normal systolic function.The right ventricular size is normal. No increase in right ventricular wall thickness.  5. Left atrial size was normal.  6. Right atrial size was normal.  7. The mitral valve is normal in structure. Mild mitral valve regurgitation. No evidence of mitral stenosis.  8. The tricuspid valve is normal in structure.  9. The aortic valve is normal in structure. Aortic valve regurgitation is not visualized. No evidence of aortic valve sclerosis or stenosis. 10. The pulmonic valve was normal in structure. Pulmonic valve regurgitation is not visualized. 11. The inferior vena cava is normal in size with greater than 50% respiratory variability, suggesting right atrial pressure of 3 mmHg. FINDINGS  Left Ventricle: Left ventricular ejection fraction, by visual estimation, is 70 to 75%. The left ventricle has hyperdynamic function. The left ventricle has no regional wall motion abnormalities. There is no left ventricular hypertrophy. Left ventricular diastolic parameters are consistent with Grade I diastolic dysfunction (impaired relaxation). Normal left atrial pressure. Right Ventricle: The right ventricular size is normal. No increase in right ventricular wall thickness. Global RV systolic function is has normal systolic function. Left Atrium: Left atrial size was normal in size. Right Atrium: Right atrial size was normal in size Pericardium: There is no evidence of pericardial effusion. Mitral Valve: The mitral valve is normal in structure. Mild mitral valve regurgitation. No evidence of mitral valve stenosis by observation. Tricuspid Valve: The tricuspid valve is normal in structure. Tricuspid valve regurgitation is mild. Aortic Valve: The aortic valve is normal in structure.. There is mild thickening of the aortic valve. Aortic valve regurgitation is not visualized. The aortic valve is structurally normal, with  no evidence of sclerosis or stenosis. Mild aortic valve annular calcification. There is mild thickening of the aortic valve. Pulmonic Valve: The pulmonic valve was normal in structure. Pulmonic valve regurgitation is not visualized. Pulmonic regurgitation is not visualized. Aorta: The aortic root, ascending aorta and aortic arch are all structurally normal, with no evidence of dilitation or obstruction. Venous: The inferior vena cava is normal in size with greater than 50% respiratory variability, suggesting right atrial pressure of 3 mmHg. IAS/Shunts: No atrial level shunt detected by color flow Doppler. There is no evidence of a patent foramen ovale. No ventricular septal defect is seen or detected. There is no evidence of an atrial septal defect.  LEFT VENTRICLE PLAX 2D LVIDd:         3.90 cm LVIDs:         2.10 cm LV PW:         0.90 cm LV IVS:        0.70 cm LVOT diam:     1.80 cm LV SV:         52 ml LV SV Index:   33.60 LVOT Area:     2.54 cm  LEFT ATRIUM             Index LA diam:        2.90 cm 1.90 cm/m LA Vol (A2C):   23.0 ml 15.06 ml/m LA Vol (A4C):   20.4 ml 13.36 ml/m LA Biplane Vol: 23.4 ml 15.32 ml/m  AORTIC VALVE LVOT Vmax:   99.10 cm/s LVOT Vmean:  82.900 cm/s LVOT VTI:  0.182 m  AORTA Ao Root diam: 2.30 cm  SHUNTS Systemic VTI:  0.18 m Systemic Diam: 1.80 cm  Tobias Alexander MD Electronically signed by Tobias Alexander MD Signature Date/Time: 01/02/2020/3:13:57 PM    Final    US Abdomen Limited RUQ  Result Date: 01/02/2020 CLINICAL DATA:  Sepsis. EXAM: ULTRASOUND ABDOMEN LIMITED RIGHT UPPER QUADRANT COMPARISON:  Noncontrast abdominal CT yesterday FINDINGS: Gallbladder: Distended. No gallstones. Mild wall thickening of 3 mm. Suspect trace pericholecystic fluid. No sonographic Murphy sign noted by sonographer. Common bile duct: Diameter: 7 mm.  No visualized choledocholithiasis. Liver: No focal lesion identified. Within normal limits in parenchymal echogenicity. Portal vein is patent on color  Doppler imaging with normal direction of blood flow towards the liver. Other: None. IMPRESSION: 1. Mild gallbladder distension and gallbladder wall thickening, trace pericholecystic fluid. No gallstones or sonographic Murphy sign. 2. Borderline common bile duct at 7 mm. 3. Consider further evaluation with HIDA scan to evaluate for cystic and common bile duct patency. MRCP could be considered, but would only be diagnostic if patient is able to tolerate breath hold technique. Electronically Signed   By: Narda Rutherford M.D.   On: 01/02/2020 05:26    Microbiology Recent Results (from the past 240 hour(s))  Blood Culture (routine x 2)     Status: None   Collection Time: 01/17/2020  6:55 PM   Specimen: BLOOD  Result Value Ref Range Status   Specimen Description BLOOD LEFT FOREARM  Final   Special Requests   Final    BOTTLES DRAWN AEROBIC AND ANAEROBIC Blood Culture results may not be optimal due to an inadequate volume of blood received in culture bottles   Culture   Final    NO GROWTH 5 DAYS Performed at [facility], [address], [place], [place] [address]    Report Status 01/06/2020 FINAL  Final  Blood Culture (routine x 2)     Status: None   Collection Time: 01/29/2020  7:00 PM   Specimen: BLOOD  Result Value Ref Range Status   Specimen Description BLOOD RIGHT ARM  Final   Special Requests   Final    BOTTLES DRAWN AEROBIC AND ANAEROBIC Blood Culture results may not be optimal due to an inadequate volume of blood received in culture bottles   Culture   Final    NO GROWTH 5 DAYS Performed at [facility], [address] [place], [place] [address]    Report Status 01/06/2020 FINAL  Final  Urine culture     Status: Abnormal   Collection Time: 01/02/2020  9:52 PM   Specimen: In/Out Cath Urine  Result Value Ref Range Status   Specimen Description IN/OUT CATH URINE  Final   Special Requests   Final    NONE Performed at [facility], [address], [place], [place]  [address]    Culture 80,000 COLONIES/mL ESCHERICHIA COLI (A)  Final   Report Status 01/04/2020 FINAL  Final   Organism ID, Bacteria ESCHERICHIA COLI (A)  Final      Susceptibility   Escherichia coli - MIC*    AMPICILLIN >=32 RESISTANT Resistant     CEFAZOLIN <=4 SENSITIVE Sensitive     CEFTRIAXONE <=0.25 SENSITIVE Sensitive     CIPROFLOXACIN <=0.25 SENSITIVE Sensitive     GENTAMICIN <=1 SENSITIVE Sensitive     IMIPENEM <=0.25 SENSITIVE Sensitive     NITROFURANTOIN <=16 SENSITIVE Sensitive     TRIMETH/SULFA <=20 SENSITIVE Sensitive     AMPICILLIN/SULBACTAM >=  32 RESISTANT Resistant     PIP/TAZO <=4 SENSITIVE Sensitive     * 80,000 COLONIES/mL ESCHERICHIA COLI  Respiratory Panel by RT PCR (Flu A&B, Covid) - Nasopharyngeal Swab     Status: None   Collection Time: 01/30/2020 10:45 PM   Specimen: Nasopharyngeal Swab  Result Value Ref Range Status   SARS Coronavirus 2 by RT PCR NEGATIVE NEGATIVE Final    Comment: (NOTE) SARS-CoV-2 target nucleic acids are NOT DETECTED. The SARS-CoV-2 RNA is generally detectable in upper respiratoy specimens during the acute phase of infection. The lowest concentration of SARS-CoV-2 viral copies this assay can detect is 131 copies/mL. A negative result does not preclude SARS-Cov-2 infection and should not be used as the sole basis for treatment or other patient management decisions. A negative result may occur with  improper specimen collection/handling, submission of specimen other than nasopharyngeal swab, presence of viral mutation(s) within the areas targeted by this assay, and inadequate number of viral copies (<131 copies/mL). A negative result must be combined with clinical observations, patient history, and epidemiological information. The expected result is Negative. Fact Sheet for Patients:  https://www.moore.com/ Fact Sheet for Healthcare Providers:  https://www.young.biz/ This test is not yet ap proved or  cleared by the [place] FDA and  has been authorized for detection and/or diagnosis of SARS-CoV-2 by FDA under an Emergency Use Authorization (EUA). This EUA will remain  in effect (meaning this test can be used) for the duration of the COVID-19 declaration under Section 564(b)(1) of the Act, 21 U.S.C. section 360bbb-3(b)(1), unless the authorization is terminated or revoked sooner.    Influenza A by PCR NEGATIVE NEGATIVE Final   Influenza B by PCR NEGATIVE NEGATIVE Final    Comment: (NOTE) The Xpert Xpress SARS-CoV-2/FLU/RSV assay is intended as an aid in  the diagnosis of influenza from Nasopharyngeal swab specimens and  should not be used as a sole basis for treatment. Nasal washings and  aspirates are unacceptable for Xpert Xpress SARS-CoV-2/FLU/RSV  testing. Fact Sheet for Patients: https://www.moore.com/ Fact Sheet for Healthcare Providers: https://www.young.biz/ This test is not yet approved or cleared by the [place] FDA and  has been authorized for detection and/or diagnosis of SARS-CoV-2 by  FDA under an Emergency Use Authorization (EUA). This EUA will remain  in effect (meaning this test can be used) for the duration of the  Covid-19 declaration under Section 564(b)(1) of the Act, 21  U.S.C. section 360bbb-3(b)(1), unless the authorization is  terminated or revoked. Performed at [facility], [address] [place], [place] [address]   MRSA PCR Screening     Status: Abnormal   Collection Time: 01/02/20  4:04 AM   Specimen: Nasopharyngeal  Result Value Ref Range Status   MRSA by PCR POSITIVE (A) NEGATIVE Final    Comment:        The GeneXpert MRSA Assay (FDA approved for NASAL specimens only), is one component of a comprehensive MRSA colonization surveillance program. It is not intended to diagnose MRSA infection nor to guide or monitor treatment for MRSA infections. RESULT CALLED TO, READ BACK BY AND  VERIFIED WITH: K EAD RN ON [id number] AT [phone] BY NFIELDS Performed at [facility], [address] [place], [place] [address]   C difficile quick scan w PCR reflex     Status: None   Collection Time: 01/02/20  8:50 AM   Specimen: Stool  Result Value Ref Range Status   C Diff antigen NEGATIVE NEGATIVE Final  C Diff toxin NEGATIVE NEGATIVE Final   C Diff interpretation No C. difficile detected.  Final    Comment: Performed at [facility], [address] [place], [place] [address]  Stool culture (children & immunocomp patients)     Status: None (Preliminary result)   Collection Time: 01/02/20  2:15 PM   Specimen: Stool  Result Value Ref Range Status   Salmonella/Shigella Screen Final report  Final    Comment: (NOTE) Performed At: [facility] [place] [address] [place], [place] [id number] Jolene SchimkeNagendra Sanjai MD XB:[id number]    Campylobacter Culture PENDING  Incomplete   E coli, Shiga toxin Assay PENDING  Incomplete  STOOL CULTURE REFLEX - RSASHR     Status: None   Collection Time: 01/02/20  2:15 PM  Result Value Ref Range Status   Stool Culture result 1 (RSASHR) Comment  Final    Comment: (NOTE) No Salmonella or Shigella recovered. Performed At: [facility] [address] [place], [place] [id number] Jolene SchimkeNagendra Sanjai MD [phone]     Lab Basic Metabolic Panel: Recent Labs  Lab 01/04/2020 1928 01/20/2020 1928 01/19/2020 2238 01/02/20 0459 01/02/20 0849 01/02/20 1847 01/02/20 2044 15-Jan-2020 0415  NA 139   < >  --  143 139 133* 137 132*  K 6.5*  --  6.3* 4.3 4.7 4.5 4.5 4.3  CL 108  --   --   --  112* 104  --  98  CO2 22  --   --   --  14* 19*  --  24  GLUCOSE 67*  --   --   --  246* 213*  --  357*  BUN 64*  --   --   --  51* 46*  --  45*  CREATININE 1.97*  --  1.80*  --  1.93* 1.99*  --  2.01*  CALCIUM 8.6*  --   --   --  7.7* 7.4*  --  6.8*   < > = values in this interval not displayed.   Liver Function Tests: Recent Labs  Lab 01/05/2020 1928  01/02/20 1847  AST 74* 87*  ALT 88* 106*  ALKPHOS 61 74  BILITOT 0.7 0.7  PROT 5.3* 4.6*  ALBUMIN 2.6* 2.1*   No results for input(s): LIPASE, AMYLASE in the last 168 hours. No results for input(s): AMMONIA in the last 168 hours. CBC: Recent Labs  Lab 01/20/2020 1928 01/12/2020 2351 01/02/20 0433 01/02/20 0459 01/02/20 2044 15-Jan-2020 0541  WBC 3.8*  --  5.9  --   --  1.2*  NEUTROABS 2.6  --   --   --   --   --   HGB 10.6* 10.2* 9.4* 8.5* 9.5* 9.2*  HCT 31.7* 30.0* 27.6* 25.0* 28.0* 26.4*  MCV 98.8  --  99.6  --   --  96.4  PLT 58*  --  69*  --   --  30*   Cardiac Enzymes: No results for input(s): CKTOTAL, CKMB, CKMBINDEX, TROPONINI in the last 168 hours. Sepsis Labs: Recent Labs  Lab 01/14/2020 1928 01/29/2020 1933 01/02/20 0433 01/02/20 0849 01/02/20 1120 15-Jan-2020 0541  WBC 3.8*  --  5.9  --   --  1.2*  LATICACIDVEN  --  1.3 7.2* 6.3* 5.2*  --     Procedures/Operations     Koren BoundWesam Ioanna Colquhoun 01/06/2020, 3:02 PM
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
## 2020-01-31 DEATH — deceased

## 2021-03-18 IMAGING — CT CT ABD-PELV W/O CM
2 of 4 series · 12 of 36 positions shown, 15 images · non-contrast
Comparison: None.

CLINICAL DATA: Hypotension and bradycardia.

EXAM:
CT CHEST, ABDOMEN AND PELVIS WITHOUT CONTRAST
TECHNIQUE: Multidetector CT imaging of the chest, abdomen and pelvis was
performed following the standard protocol without IV contrast.

[Series 3: cap wo 5.0 i31f 2 · axial · 0.88mm/px · z∈[+175,+705]mm · 9 of 128 slices shown, 12 images]
[im 11/128  mediastinal]
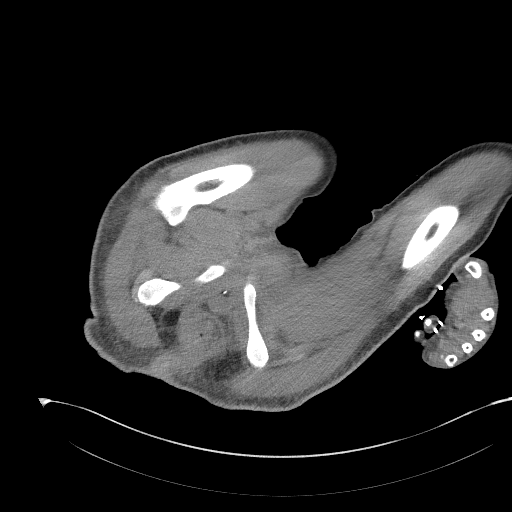
[im 11/128  lung]
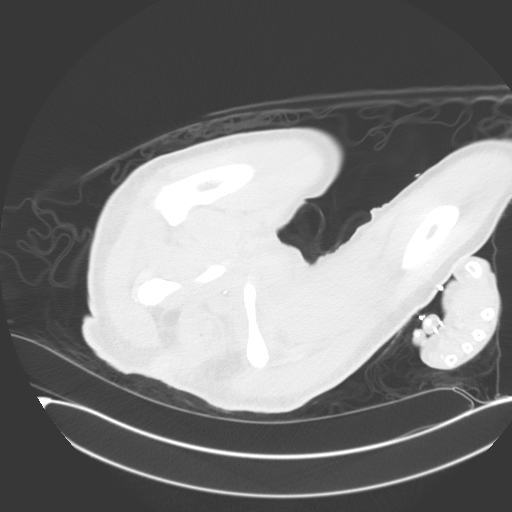
[im 22/128  lung]
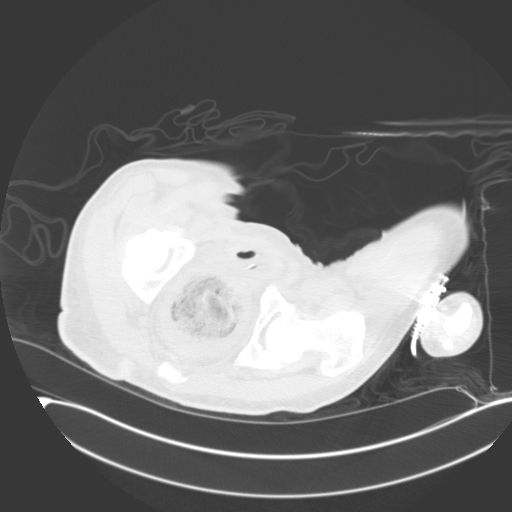
[im 43/128  lung]
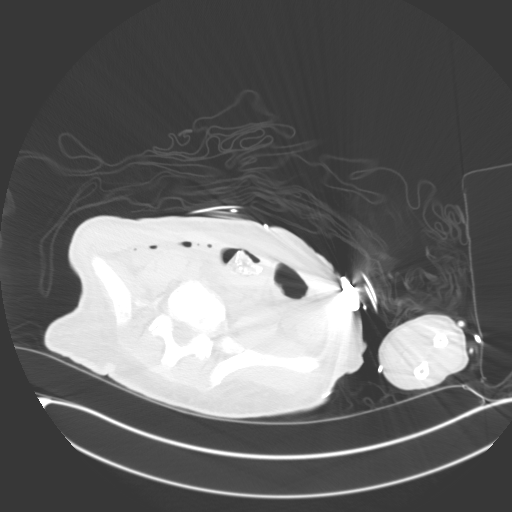
[im 53/128  lung]
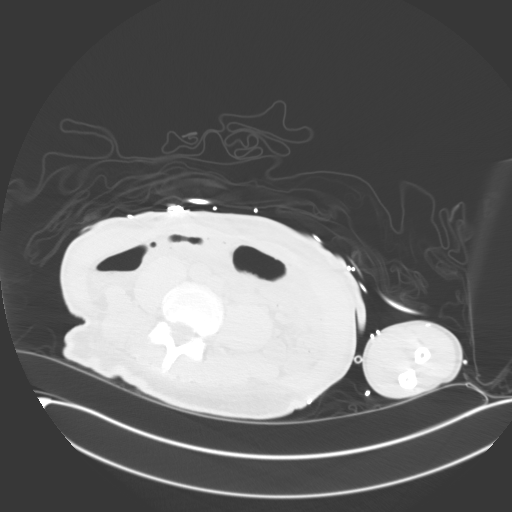
[im 64/128  mediastinal]
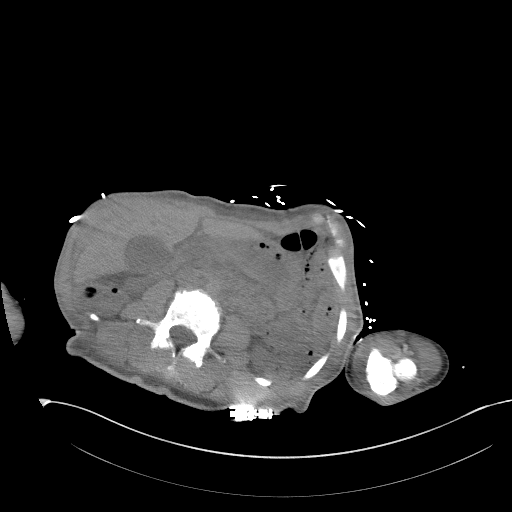
[im 64/128  lung]
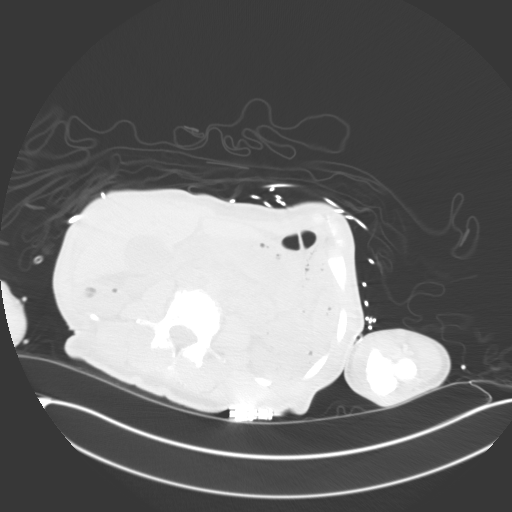
[im 75/128  lung]
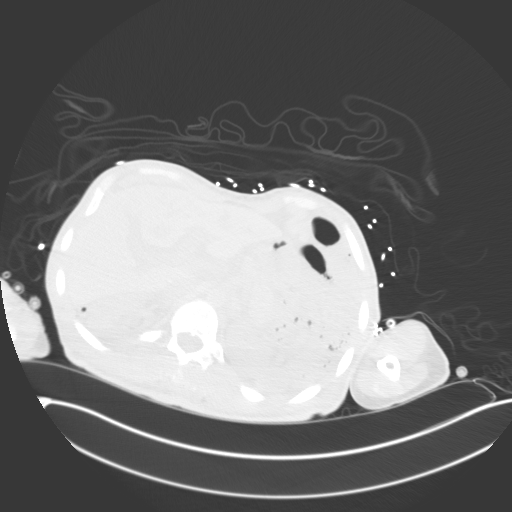
[im 85/128  lung]
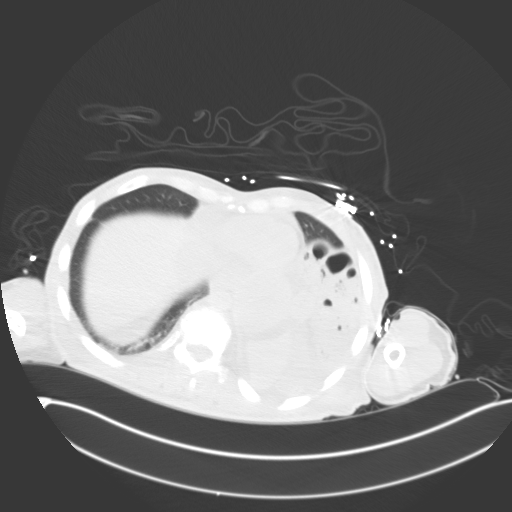
[im 106/128  lung]
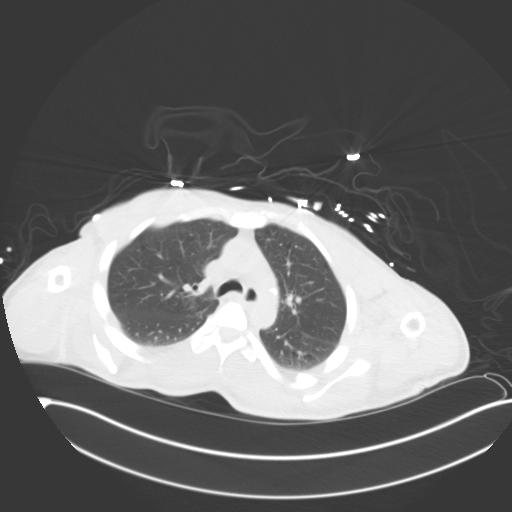
[im 117/128  mediastinal]
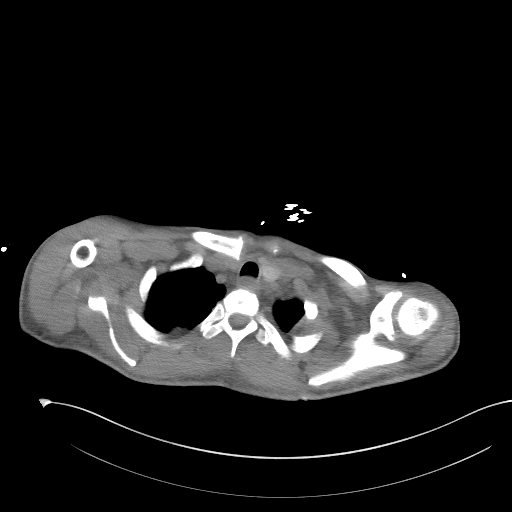
[im 117/128  lung]
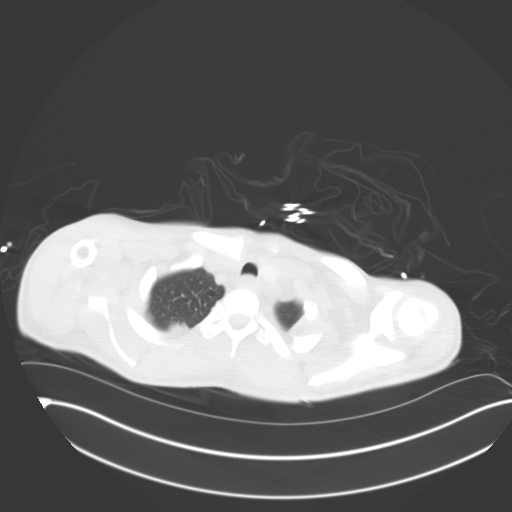

[Series 6: coronal · coronal · 0.72mm/px · 3 of 142 slices shown]
[im 29/142  lung]
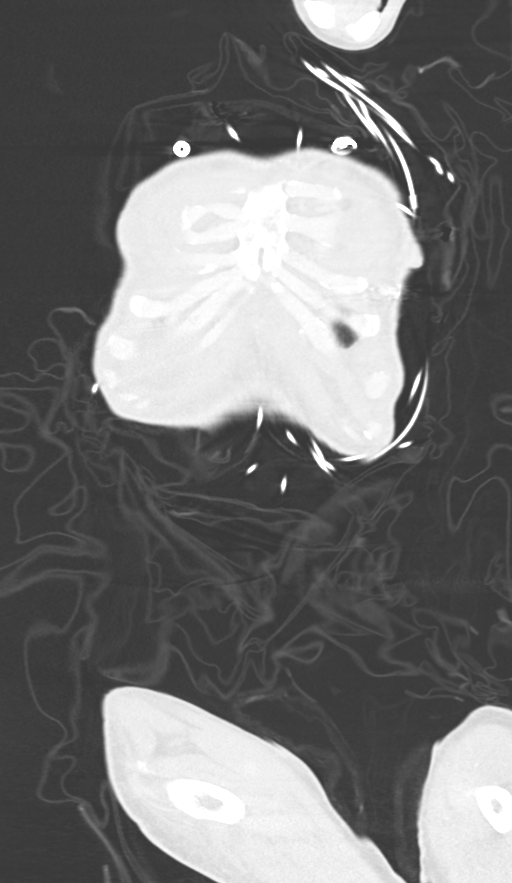
[im 57/142  lung]
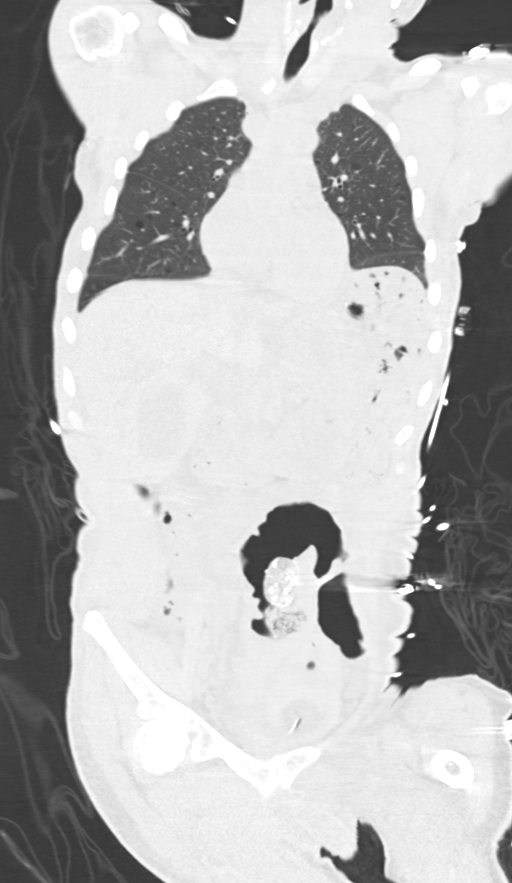
[im 85/142  lung]
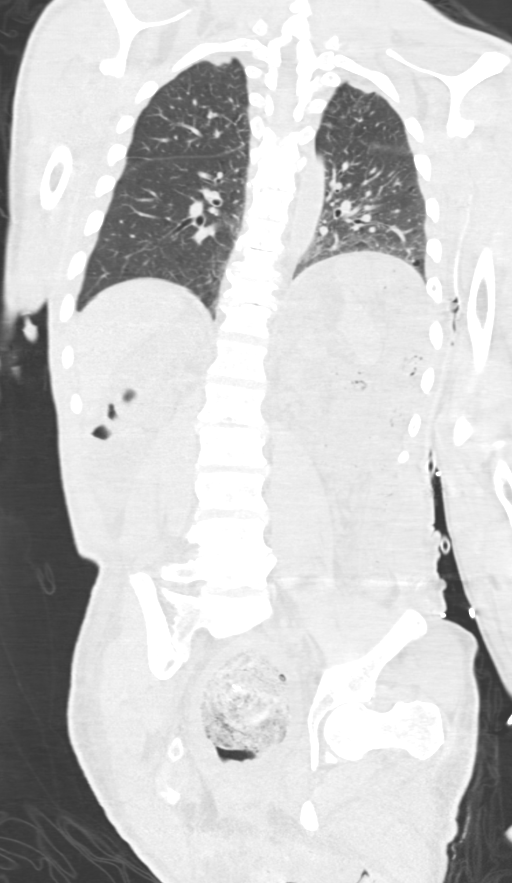

[12 of 36 positions shown; findings below may reference images not displayed]

FINDINGS: CT CHEST FINDINGS

Cardiovascular: The heart size is normal. Aortic calcifications are
noted. There is no thoracic aortic aneurysm. The intracardiac blood
pool is hypodense relative to the adjacent myocardium consistent
with anemia.

Mediastinum/Nodes:

--No mediastinal or hilar lymphadenopathy.

--No axillary lymphadenopathy.

--No supraclavicular lymphadenopathy.

--Normal thyroid gland.

--The esophagus is unremarkable

Lungs/Pleura: Mild emphysematous changes are noted bilaterally.
There is scattered bilateral micro nodules, perhaps most evident in
the upper lobes and at the lung bases bilaterally. There is a
developing airspace opacity at the left lung base. There is no
pneumothorax. There is no significant pleural effusion.

Musculoskeletal: Advanced disc height loss is noted at the T10-T11
and T9-T10 levels. There is no obvious displaced fracture.

CT ABDOMEN PELVIS FINDINGS

Hepatobiliary: Extensive periportal edema is noted. There is
gallbladder wall thickening.The biliary tree is not well evaluated
secondary to lack of IV contrast and streak artifact through the
patient's abdomen.

Pancreas: Normal contours without ductal dilatation. No
peripancreatic fluid collection.

Spleen: No splenic laceration or hematoma.

Adrenals/Urinary Tract:

--Adrenal glands: No adrenal hemorrhage.

--Right kidney/ureter: No hydronephrosis or perinephric hematoma.

--Left kidney/ureter: There is a cyst in the interpolar region of
the left kidney.

--Urinary bladder: The bladder is decompressed with a Foley
catheter.

Stomach/Bowel:

--Stomach/Duodenum: No hiatal hernia or other gastric abnormality.
Normal duodenal course and caliber.

--Small bowel: No dilatation or inflammation.

--Colon: There is moderate to severe rectal wall thickening. There
is a large amount of stool in the patient's rectum.

--Appendix: Normal.

Vascular/Lymphatic: Normal course and caliber of the major abdominal
vessels.

--No retroperitoneal lymphadenopathy.

--No mesenteric lymphadenopathy.

--No pelvic or inguinal lymphadenopathy.

Reproductive: Unremarkable

Other: There is a small volume of free fluid in the patient's
abdomen. There is no free air. There is body wall edema.

Musculoskeletal. No acute displaced fractures. Advanced multilevel
disc degenerative changes are noted involving the lumbar spine.
IMPRESSION: 1. Extensive periportal edema and gallbladder wall thickening. The
biliary tree is not well evaluated secondary to lack of IV contrast
and streak artifact through the patient's abdomen. If there is
concern for acute cholecystitis, a right upper quadrant ultrasound
may be of benefit. The periportal edema may be secondary to the
patient's volume status or be related to acute hepatitis.
2. There is a developing airspace opacity at the left lung base,
concerning for pneumonia.
3. There is moderate to severe rectal wall thickening with a large
amount of stool in the patient's rectum. This could be secondary to
an infectious or inflammatory process or stercoral colitis.
4. Scattered micro nodules throughout the lungs bilaterally of
unknown clinical significance, favored to represent infectious or
inflammatory process. Consider a three-month follow-up CT to confirm
stability or resolution of this finding.
5. There is a small volume of free fluid in the patient's abdomen
and pelvis.
6. Anemia.
7. Mild body wall edema.

Aortic Atherosclerosis (XMGFQ-[id number].F) and Emphysema (XMGFQ-[id number].I).
